# Patient Record
Sex: Female | Born: 2000 | Race: White | Hispanic: Yes | Marital: Single | Smoking: Never smoker
Health system: Southern US, Community
[De-identification: ages and names within clinical notes are randomized; demographics above are authoritative.]

## PROBLEM LIST (undated history)

## (undated) DIAGNOSIS — E119 Type 2 diabetes mellitus without complications: Secondary | ICD-10-CM

## (undated) DIAGNOSIS — S36113A Laceration of liver, unspecified degree, initial encounter: Secondary | ICD-10-CM

## (undated) HISTORY — DX: Type 2 diabetes mellitus without complications: E11.9

## (undated) HISTORY — DX: Laceration of liver, unspecified degree, initial encounter: S36.113A

---
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
## 2020-01-01 ENCOUNTER — Other Ambulatory Visit (INDEPENDENT_AMBULATORY_CARE_PROVIDER_SITE_OTHER): Payer: Self-pay | Admitting: Family

## 2020-01-01 MED ORDER — INSULIN LISPRO 100 UNIT/ML ~~LOC~~ SOLN: SUBCUTANEOUS | 5 refills | Status: DC

## 2020-01-07 ENCOUNTER — Other Ambulatory Visit (INDEPENDENT_AMBULATORY_CARE_PROVIDER_SITE_OTHER): Payer: Self-pay | Admitting: Family

## 2020-01-07 DIAGNOSIS — E1065 Type 1 diabetes mellitus with hyperglycemia: Secondary | ICD-10-CM

## 2020-01-11 ENCOUNTER — Other Ambulatory Visit: Payer: Self-pay

## 2020-01-11 ENCOUNTER — Encounter: Payer: Self-pay | Admitting: Cardiology

## 2020-01-11 ENCOUNTER — Ambulatory Visit (INDEPENDENT_AMBULATORY_CARE_PROVIDER_SITE_OTHER): Payer: BC Managed Care – PPO | Admitting: Cardiology

## 2020-01-11 VITALS — BP 110/62 | HR 71 | Ht <= 58 in | Wt 174.4 lb

## 2020-01-11 DIAGNOSIS — R002 Palpitations: Secondary | ICD-10-CM | POA: Diagnosis not present

## 2020-01-11 DIAGNOSIS — Z7189 Other specified counseling: Secondary | ICD-10-CM

## 2020-01-11 DIAGNOSIS — R072 Precordial pain: Secondary | ICD-10-CM | POA: Diagnosis not present

## 2020-01-11 DIAGNOSIS — E109 Type 1 diabetes mellitus without complications: Secondary | ICD-10-CM

## 2020-01-11 NOTE — Patient Instructions (Signed)
Medication Instructions:  Your Physician recommend you continue on your current medication as directed.    *If you need a refill on your cardiac medications before your next appointment, please call your pharmacy*  Lab Work: None  Testing/Procedures: None  Follow-Up: At [facility], you and your health needs are our priority.  As part of our continuing mission to provide you with exceptional heart care, we have created designated Provider Care Teams.  These Care Teams include your primary Cardiologist (physician) and Advanced Practice Providers (APPs -  Physician Assistants and Nurse Practitioners) who all work together to provide you with the care you need, when you need it.  Your next appointment:   As needed   The format for your next appointment:   Either In Person or Virtual  Provider:   Bridgette Christopher, MD   

## 2020-01-11 NOTE — Progress Notes (Signed)
Cardiology Office Note:    Date:  01/11/2020   ID:  Chelsey [name], DOB [birth year], MRN [id number]  PCP:  Patient, No Pcp Per  Cardiologist:  Buford Dresser, MD  Referring MD: Hermenia Bers, NP   CC: new patient consultation for the evaluation and management of palpitations and chest pain  History of Present Illness:    Chelsey [name] is a 19 y.o. female with a hx of type I diabetes who is seen as a new consult at the request of Hermenia Bers, NP for the evaluation and management of palpitations and chest pain.  Note from 11/06/19 with Hermenia Bers reviewed. She noted random palpitations that last several minutes.  Chest pain and palpitations: -Initial onset: a pressure sensation throughout her left side of the chest or the top central of her chest, mild, does not stop her from doing her daily activities. She denies any racing or fluttering sensations. -Frequency/Duration: has been decreasing in frequency, only 3 times since 10/2019. On average, lasted about 7 minutes. -Associated symptoms: she cannot clearly associated any additional symptoms -Aggravating/alleviating factors: no clear factors that she can determine.  -Syncope/near syncope: none -Prior cardiac history: none -Prior ECG: NSR -Prior workup: none -Prior treatment: none -Possible medication interactions: on insulin for type I diabetes -Caffeine: has cut back from last year, now down from 10-12 cups of coffee/week to now 5-6 cups/week -Alcohol: none recently -Tobacco: never -OTC supplements: rare ibuprofen -Comorbidities: type I diabetes -Exercise level: no limitations. 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 -Cardiac ROS: no shortness of breath recently, no PND, no orthopnea, no LE edema. -Family history: no CV history that she knows of.  Past Medical History:  Diagnosis Date  . Liver  laceration 01/16/14   sledding accident    History reviewed. No pertinent surgical history.  Current Medications: Current Outpatient Medications on File Prior to Visit  Medication Sig  . Accu-Chek FastClix Lancets MISC Check sugar 8 x daily  . BD PEN NEEDLE NANO U/F 32G X 4 MM MISC Use to inject insulin up to 8x per day,.  . Blood Glucose Monitoring Suppl (ACCU-CHEK GUIDE) w/Device KIT 1 kit by Does not apply route as needed.  . Continuous Blood Gluc Sensor (DEXCOM G6 SENSOR) MISC 1 KIT BY DOES NOT APPLY ROUTE DAILY AS NEEDED.  Marland Kitchen Glucagon (BAQSIMI TWO PACK) 3 MG/DOSE POWD Place 3 mg into the nose as needed.  Marland Kitchen glucagon 1 MG injection Follow package directions for low blood sugar.  Marland Kitchen glucose blood (ACCU-CHEK GUIDE) test strip Check glucose 6x daily  . ibuprofen (ADVIL,MOTRIN) 200 MG tablet Take 200 mg by mouth every 6 (six) hours as needed for headache.  . Insulin Glargine (LANTUS SOLOSTAR) 100 UNIT/ML Solostar Pen INJECT UP TO 50 UNITS SUBCUTANEOUSLY PER DAY  . insulin lispro (HUMALOG) 100 UNIT/ML injection Give up to 100 units per day via insulin pump  . OneTouch Delica Lancets 46K MISC 1 Units by Does not apply route as directed.  . Continuous Blood Gluc Transmit (DEXCOM G6 TRANSMITTER) MISC 1 kit by Does not apply route daily.   No current facility-administered medications on file prior to visit.     Allergies:   Patient has no known allergies.   Social History   Tobacco Use  . Smoking status: Never Smoker  . Smokeless tobacco: Never Used  Substance Use Topics  . Alcohol use: No  . Drug use: No  Family History: family history includes Diabetes in her maternal grandfather; Hyperlipidemia in her maternal grandfather; Learning disabilities in her cousin and mother; Thyroid disease in her maternal grandmother; Vision loss in her maternal grandfather.  ROS:   Please see the history of present illness.  Additional pertinent ROS: Constitutional: Negative for chills, fever,  night sweats, unintentional weight loss  HENT: Negative for ear pain and hearing loss.   Eyes: Negative for loss of vision and eye pain.  Respiratory: Negative for cough, sputum, wheezing.   Cardiovascular: See HPI. Gastrointestinal: Negative for abdominal pain, melena, and hematochezia.  Genitourinary: Negative for dysuria and hematuria.  Musculoskeletal: Negative for falls and myalgias.  Skin: Negative for itching and rash.  Neurological: Negative for focal weakness, focal sensory changes and loss of consciousness.  Endo/Heme/Allergies: Does not bruise/bleed easily.     EKGs/Labs/Other Studies Reviewed:    The following studies were reviewed today: none  EKG:  EKG is personally reviewed.  The ekg ordered today demonstrates sinus rhythm with sinus arrhythmia, normal for age  Recent Labs: 11/06/2019: ALT 14; BUN 11; Creat 0.78; Potassium 4.2; Sodium 136; TSH 2.16  Recent Lipid Panel    Component Value Date/Time   CHOL 179 (H) 11/06/2019 1112   TRIG 98 (H) 11/06/2019 1112   HDL 55 11/06/2019 1112   CHOLHDL 3.3 11/06/2019 1112   VLDL 14 12/20/2013 1148   LDLCALC 105 11/06/2019 1112    Physical Exam:    VS:  BP 110/62   Pulse 71   Ht 4' 10" (1.473 m)   Wt 174 lb 6.4 oz (79.1 kg)   SpO2 98%   BMI 36.45 kg/m     Wt Readings from Last 3 Encounters:  01/11/20 174 lb 6.4 oz (79.1 kg) (94 %, Z= 1.52)*  11/06/19 172 lb 3.2 oz (78.1 kg) (93 %, Z= 1.49)*  07/13/19 167 lb (75.8 kg) (92 %, Z= 1.40)*   * Growth percentiles are based on CDC (Girls, 2-20 Years) data.    GEN: Well nourished, well developed in no acute distress HEENT: Normal, moist mucous membranes NECK: No JVD CARDIAC: regular rhythm, normal S1 and S2, no rubs or gallops. No murmurs. VASCULAR: Radial and DP pulses 2+ bilaterally. No carotid bruits RESPIRATORY:  Clear to auscultation without rales, wheezing or rhonchi  ABDOMEN: Soft, non-tender, non-distended MUSCULOSKELETAL:  Ambulates independently SKIN: Warm  and dry, no edema NEUROLOGIC:  Alert and oriented x 3. No focal neuro deficits noted. PSYCHIATRIC:  Normal affect    ASSESSMENT:    1. Precordial pain   2. Heart palpitations   3. Type 1 diabetes mellitus without complication (HCC)   4. Cardiac risk counseling   5. Counseling on health promotion and disease prevention    PLAN:    Atypical chest pain, palpitations:  We spent significant time today reviewing different parts of the cardiovascular system (electrical, vascular, functional, and valvular). We discussed how each of these systems can present with different symptoms. We reviewed that there are many noncardiac causes as well that can contribute to symptoms. -we discussed a monitor for her palpitations. As her symptoms have been decreasing, this is likely to be low yield at this time. However, if symptoms increase in frequency, we will send a monitor to rule out arrhythmias -we discussed the chest pain at length today. We discussed that, even with her several year history of type I diabetes, her risk for CAD is exceptionally low. Based on this, guidelines would recommend not testing given risk of false  positive results. -we discuss etiology of CAD and Addo term risk, see below  Type I diabetes: We discussed [place] term CV risk and diabetes. She was tearful about this, as she is concerned that the trouble with glucose control will affect her organs in the [place] term. She has an upcoming appt to get her insulin pump set up.  -we discussed at length that this is Mitch term risk. We discussed that good glucose control is key to preventing [place] term complications, and she is already focused on this -with her low ASCVD risk and young age, would not start statin until post childbearing  Cardiac risk counseling and prevention recommendations: -recommend heart healthy/Mediterranean diet, with whole grains, fruits, vegetable, fish, lean meats, nuts, and olive oil. Limit salt. -recommend moderate  walking, 3-5 times/week for 30-50 minutes each session. Aim for at least 150 minutes.week. Goal should be pace of 3 miles/hours, or walking 1.5 miles in 30 minutes -recommend avoidance of tobacco products. Avoid excess alcohol. -Additional risk factor control:  -Diabetes risk: A1c is 8.2, working on type I diabetes control as above  -Lipids: reviewed as above, no indication for statin givne age  -Blood pressure control: at goal on no meds  -Weight: BMI 36, working on this -ASCVD risk score: The ASCVD Risk score Mikey Bussing DC Jr., et al., 2013) failed to calculate for the following reasons:   The 2013 ASCVD risk score is only valid for ages 21 to 67    Plan for follow up: given her young age, she can follow up as needed for now, but her CV risk factors should be followed closely. I would be happy to see her again if additional questions arise.  Medication Adjustments/Labs and Tests Ordered: Current medicines are reviewed at length with the patient today.  Concerns regarding medicines are outlined above.  Orders Placed This Encounter  Procedures  . EKG 12-Lead   No orders of the defined types were placed in this encounter.   Patient Instructions  Medication Instructions:  Your Physician recommend you continue on your current medication as directed.    *If you need a refill on your cardiac medications before your next appointment, please call your pharmacy*  Lab Work: None  Testing/Procedures: None  Follow-Up: At [facility], you and your health needs are our priority.  As part of our continuing mission to provide you with exceptional heart care, we have created designated Provider Care Teams.  These Care Teams include your primary Cardiologist (physician) and Advanced Practice Providers (APPs -  Physician Assistants and Nurse Practitioners) who all work together to provide you with the care you need, when you need it.  Your next appointment:   As needed  The format for your next  appointment:   Either In Person or Virtual  Provider:   Buford Dresser, MD     Signed, Buford Dresser, MD PhD 01/11/2020  [place]

## 2020-01-17 ENCOUNTER — Other Ambulatory Visit (INDEPENDENT_AMBULATORY_CARE_PROVIDER_SITE_OTHER): Payer: Self-pay

## 2020-01-17 NOTE — Telephone Encounter (Signed)
A user error has taken place: encounter opened in error, closed for administrative reasons.

## 2020-01-28 ENCOUNTER — Other Ambulatory Visit (INDEPENDENT_AMBULATORY_CARE_PROVIDER_SITE_OTHER): Payer: Self-pay | Admitting: Family

## 2020-01-28 DIAGNOSIS — E1065 Type 1 diabetes mellitus with hyperglycemia: Secondary | ICD-10-CM

## 2020-01-28 MED ORDER — BAQSIMI TWO PACK 3 MG/DOSE NA POWD: 3.0000 mg | NASAL | 3 refills | Status: DC | PRN

## 2020-01-28 MED ORDER — ACCU-CHEK GUIDE VI STRP: ORAL_STRIP | 6 refills | Status: DC

## 2020-02-05 ENCOUNTER — Encounter (INDEPENDENT_AMBULATORY_CARE_PROVIDER_SITE_OTHER): Payer: Self-pay | Admitting: Family

## 2020-02-05 ENCOUNTER — Other Ambulatory Visit: Payer: Self-pay

## 2020-02-05 ENCOUNTER — Ambulatory Visit (INDEPENDENT_AMBULATORY_CARE_PROVIDER_SITE_OTHER): Payer: BC Managed Care – PPO | Admitting: Family

## 2020-02-05 VITALS — BP 112/72 | HR 80 | Ht <= 58 in | Wt 175.4 lb

## 2020-02-05 DIAGNOSIS — R739 Hyperglycemia, unspecified: Secondary | ICD-10-CM | POA: Diagnosis not present

## 2020-02-05 DIAGNOSIS — Z4681 Encounter for fitting and adjustment of insulin pump: Secondary | ICD-10-CM

## 2020-02-05 DIAGNOSIS — E1065 Type 1 diabetes mellitus with hyperglycemia: Secondary | ICD-10-CM

## 2020-02-05 DIAGNOSIS — F54 Psychological and behavioral factors associated with disorders or diseases classified elsewhere: Secondary | ICD-10-CM | POA: Diagnosis not present

## 2020-02-05 DIAGNOSIS — E10649 Type 1 diabetes mellitus with hypoglycemia without coma: Secondary | ICD-10-CM

## 2020-02-05 LAB — POCT GLYCOSYLATED HEMOGLOBIN (HGB A1C): Hemoglobin A1C: 6.9 % — AB (ref 4.0–5.6)

## 2020-02-05 LAB — POCT GLUCOSE (DEVICE FOR HOME USE): Glucose Fasting, POC: 117 mg/dL — AB (ref 70–99)

## 2020-02-05 NOTE — Patient Instructions (Signed)
-  Always have fast sugar with you in case of low blood sugar (glucose tabs, regular juice or soda, candy) -Always wear your ID that states you have diabetes -Always bring your meter to your visit -Call/Email if you want to review blood sugars   

## 2020-02-05 NOTE — Progress Notes (Signed)
Pediatric Endocrinology Diabetes Consultation Follow-up Visit  Chelsey [name] 2001-08-15 [id number]  Chief Complaint: Follow-up type 1 diabetes   Patient, No Pcp Per   HPI: Chelsey [name]  is a 19 y.o. female presenting for follow-up of type 1 diabetes. she is accompanied to this visit by her her mother, older brother and older sister. .  1. She presented to the ER on 10/26/2017 with 2 months of polyuria, polydipsia and weigh tloss. Her glucose was 547 on arrival to ER. She was admitted to PICU for IV insulin and fluids. Once stabilized she was transferred to the pediatric floor for diabetes education and was started on Basaglar and Novolog insulin.   2. Since her last visit to clinic on 06/2019, [name] has been generally healthy.  She has been busy with school and work. She finds online school harder to focus. In her free time she mainly relaxes and plays on her phone. She was started on Tslim insulin pump two weeks ago, she had online training. She was very upset because she was having frequent low blood sugars. She reached out to me a few days ago and found the problem was the her correction factor was set to 1 unit for every 4 mg/dl instead of 1 unit for every 40 mg/dl. Since the change she is not having lows.   Upon further review of her insulin pump. It was discovered that she had created a second profile (she is unsure how). Her basal rate was set at 3 units per hour which is double what it should be.    Insulin regimen: TSlim insulin pump  Basal Rates 12AM 1.60  6am 1.65  11pm 1.60          Insulin to Carbohydrate Ratio 12AM 9  6am 8  9pm 8           Insulin Sensitivity Factor 12AM 40  6am 30  11pm 40          Target Blood Glucose 12AM 120                 Hypoglycemia: Able to feel low blood sugars.  No glucagon needed recently.  Insulin Pump and CGm download   Med-alert ID: Not currently wearing. Injection sites: arms, legs and abdomen. Annual labs due:  Ordered.  Ophthalmology due: 10/2020    3. ROS: Greater than 10 systems reviewed with pertinent positives listed in HPI, otherwise neg. Constitutional: Weight stable. Sleeping well.  Eyes: No changes in vision. No blurry vision.  Ears/Nose/Mouth/Throat: No difficulty swallowing. No neck pain  Cardiovascular: No palpitations or tachycardia.  Respiratory: No increased work of breathing. No SOB  Gastrointestinal: No constipation or diarrhea. No abdominal pain Genitourinary: No nocturia, no polyuria Musculoskeletal: No joint pain Neurologic: Normal sensation, no tremor Endocrine: No polydipsia.  No hyperpigmentation Psychiatric:. Denies depression and anxiety.   Past Medical History:   Past Medical History:  Diagnosis Date  . Liver laceration 01/16/14   sledding accident    Medications:  Outpatient Encounter Medications as of 02/05/2020  Medication Sig  . Accu-Chek FastClix Lancets MISC Check sugar 8 x daily  . BD PEN NEEDLE NANO U/F 32G X 4 MM MISC Use to inject insulin up to 8x per day,.  . Blood Glucose Monitoring Suppl (ACCU-CHEK GUIDE) w/Device KIT 1 kit by Does not apply route as needed.  . Continuous Blood Gluc Sensor (DEXCOM G6 SENSOR) MISC 1 KIT BY DOES NOT APPLY ROUTE DAILY AS NEEDED.  Marland Kitchen Glucagon (BAQSIMI TWO PACK) 3  MG/DOSE POWD Place 3 mg into the nose as needed.  Marland Kitchen glucose blood (ACCU-CHEK GUIDE) test strip Check glucose 6x daily  . ibuprofen (ADVIL,MOTRIN) 200 MG tablet Take 200 mg by mouth every 6 (six) hours as needed for headache.  . Insulin Glargine (LANTUS SOLOSTAR) 100 UNIT/ML Solostar Pen INJECT UP TO 50 UNITS SUBCUTANEOUSLY PER DAY  . insulin lispro (HUMALOG) 100 UNIT/ML injection Give up to 100 units per day via insulin pump  . OneTouch Delica Lancets 15B MISC 1 Units by Does not apply route as directed.  . Continuous Blood Gluc Transmit (DEXCOM G6 TRANSMITTER) MISC 1 kit by Does not apply route daily.  Marland Kitchen glucagon 1 MG injection Follow package directions for low  blood sugar. (Patient not taking: Reported on 02/05/2020)   No facility-administered encounter medications on file as of 02/05/2020.    Allergies: No Known Allergies  Surgical History: No past surgical history on file.  Family History:  Family History  Problem Relation Age of Onset  . Learning disabilities Mother   . Thyroid disease Maternal Grandmother   . Diabetes Maternal Grandfather   . Hyperlipidemia Maternal Grandfather   . Vision loss Maternal Grandfather   . Learning disabilities Cousin        mat cousin has ADHD     Social History: Lives with: Mother, older brother and older sister.  Freshman at [employer]   Physical Exam:  Vitals:   02/05/20 1122  BP: 112/72  Pulse: 80  Weight: 175 lb 6.4 oz (79.6 kg)  Height: 4' 9.95" (1.472 m)   BP 112/72   Pulse 80   Ht 4' 9.95" (1.472 m)   Wt 175 lb 6.4 oz (79.6 kg)   LMP 01/13/2020 (Within Days)   BMI 36.72 kg/m  Body mass index: body mass index is 36.72 kg/m. Blood pressure percentiles are not available for patients who are 18 years or older.  Ht Readings from Last 3 Encounters:  02/05/20 4' 9.95" (1.472 m) (<1 %, Z= -2.47)*  01/11/20 4' 10" (1.473 m) (<1 %, Z= -2.45)*  11/06/19 4' 9.72" (1.466 m) (<1 %, Z= -2.56)*   * Growth percentiles are based on CDC (Girls, 2-20 Years) data.   Wt Readings from Last 3 Encounters:  02/05/20 175 lb 6.4 oz (79.6 kg) (94 %, Z= 1.54)*  01/11/20 174 lb 6.4 oz (79.1 kg) (94 %, Z= 1.52)*  11/06/19 172 lb 3.2 oz (78.1 kg) (93 %, Z= 1.49)*   * Growth percentiles are based on CDC (Girls, 2-20 Years) data.    Physical Exam   General: Well developed, well nourished female in no acute distress.  Alert and oriented.  Head: Normocephalic, atraumatic.   Eyes:  Pupils equal and round. EOMI.   Sclera white.  No eye drainage.   Ears/Nose/Mouth/Throat: Nares patent, no nasal drainage.  Normal dentition, mucous membranes moist.   Neck: supple, no cervical lymphadenopathy, no  thyromegaly Cardiovascular: regular rate, normal S1/S2, no murmurs Respiratory: No increased work of breathing.  Lungs clear to auscultation bilaterally.  No wheezes. Abdomen: soft, nontender, nondistended. Normal bowel sounds.  No appreciable masses  Extremities: warm, well perfused, cap refill < 2 sec.   Musculoskeletal: Normal muscle mass.  Normal strength Skin: warm, dry.  No rash or lesions. Neurologic: alert and oriented, normal speech, no tremor   Labs: Last hemoglobin A1c:8.2% on 10/2019 Lab Results  Component Value Date   HGBA1C 6.9 (A) 02/05/2020     Assessment/Plan: Chelsey [name] is a 19 y.o. female with uncontrolled  type 1 diabetes, recently started on insulin pump therapy. There appears to have been issues with her insulin pump education and she had the wrong settings entered which were causing frequent hypoglycemia. Restarted the correct insulin pump profile today. Hemoglobin A1c is 6.7% which meets ADA goal of <7.5%.  1-3. type 1 diabetes mellitus in pediatric patient (HCC)/Hyperglycemia/ hypoglycemia  - - Reviewed insulin pump and CGM download. Discussed trends and patterns.  - Rotate pump sites to prevent scar tissue.  - bolus 15 minutes prior to eating to limit blood sugar spikes.  - Reviewed carb counting and importance of accurate carb counting.  - Discussed signs and symptoms of hypoglycemia. Always have glucose available.  - POCT glucose and hemoglobin A1c  - Reviewed growth chart.  - Lipid panel, TFTs and Microalbumin ordered.    4. Maladaptive behavior - Discussed concerns and barriers to care  - Answered questions.   5. Insulin pump titration  - Profile changed    Insulin to Carbohydrate Ratio 12AM 9  6am 8--> 7   9pm 8             Follow-up: 3 months   LOS: >50 spent today reviewing the medical chart, counseling the patient/family, and documenting today's visit.  When a patient is on insulin, intensive monitoring of blood glucose levels is  necessary to avoid hyperglycemia and hypoglycemia. Severe hyperglycemia/hypoglycemia can lead to hospital admissions and be life threatening.    Hermenia Bers,  FNP-C  Pediatric Specialist  [address] [place]  [place], [address]  Tele: [phone]

## 2020-02-06 LAB — LIPID PANEL
Cholesterol: 187 mg/dL — ABNORMAL HIGH (ref <170)
HDL: 53 mg/dL (ref >45)
LDL Cholesterol (Calc): 120 mg/dL (calc) — ABNORMAL HIGH (ref <110)
Non-HDL Cholesterol (Calc): 134 mg/dL (calc) — ABNORMAL HIGH (ref <120)
Total CHOL/HDL Ratio: 3.5 (calc) (ref <5.0)
Triglycerides: 47 mg/dL (ref <90)

## 2020-02-06 LAB — MICROALBUMIN / CREATININE URINE RATIO
Creatinine, Urine: 185 mg/dL (ref 20–275)
Microalb Creat Ratio: 2 mcg/mg creat (ref <30)
Microalb, Ur: 0.3 mg/dL

## 2020-02-06 LAB — T4, FREE: Free T4: 0.8 ng/dL (ref 0.8–1.4)

## 2020-02-06 LAB — TSH: TSH: 1.44 mIU/L

## 2020-02-21 ENCOUNTER — Telehealth (INDEPENDENT_AMBULATORY_CARE_PROVIDER_SITE_OTHER): Payer: Self-pay | Admitting: Family

## 2020-02-21 NOTE — Telephone Encounter (Signed)
  Who's calling (name and relationship to patient) : Carlisia   Best contact number: [phone]  Provider they see: Gretchen Short   Reason for call: Patient called to speak with Spenser or clinic staff about the pen needle for her insulin pump. [facility] advised that they do not carry the pen needle that she needs and she is almost out of supplies. Please call patient to discuss further     PRESCRIPTION REFILL ONLY  Name of prescription:  Pharmacy:

## 2020-02-22 ENCOUNTER — Encounter (INDEPENDENT_AMBULATORY_CARE_PROVIDER_SITE_OTHER): Payer: Self-pay

## 2020-02-22 NOTE — Telephone Encounter (Signed)
Spoke with patient and she was referencing the 11ml syringe, and 26G needle she uses to draw insulin from the vial to her cartridge. This medical assistant informed her she would reach out the the [employer] rep to see if those were automatically included with the cartridge boxes that are shipped to the patients. If not then we will send a prescription to the pharmacy. Patient states understanding and ended the call.

## 2020-02-25 ENCOUNTER — Telehealth (INDEPENDENT_AMBULATORY_CARE_PROVIDER_SITE_OTHER): Payer: Self-pay | Admitting: Family

## 2020-02-25 NOTE — Telephone Encounter (Signed)
Who's calling (name and relationship to patient) : Chelsey [name]   Best contact number: [phone]  Provider they see: Gretchen Short  Reason for call: Patient called to check up on the status of her 3 ml syringe and 26G needle after speaking with Marijean Niemann last week.   Solae requested that if these supplies had to be sent to pharmacy they be sent to [facility]  Call ID:      PRESCRIPTION REFILL ONLY  Name of prescription:  Pharmacy:

## 2020-02-25 NOTE — Telephone Encounter (Signed)
Left a HIPAA compliant voicemail informing patient answers are in the previous mychart message and to call back if she has any further questions.

## 2020-02-29 ENCOUNTER — Other Ambulatory Visit (INDEPENDENT_AMBULATORY_CARE_PROVIDER_SITE_OTHER): Payer: Self-pay | Admitting: Family

## 2020-02-29 DIAGNOSIS — E1065 Type 1 diabetes mellitus with hyperglycemia: Secondary | ICD-10-CM

## 2020-05-08 ENCOUNTER — Encounter (INDEPENDENT_AMBULATORY_CARE_PROVIDER_SITE_OTHER): Payer: Self-pay | Admitting: Family

## 2020-05-08 ENCOUNTER — Ambulatory Visit (INDEPENDENT_AMBULATORY_CARE_PROVIDER_SITE_OTHER): Payer: BC Managed Care – PPO | Admitting: Family

## 2020-05-08 ENCOUNTER — Other Ambulatory Visit: Payer: Self-pay

## 2020-05-08 VITALS — BP 110/74 | HR 70 | Wt 171.4 lb

## 2020-05-08 DIAGNOSIS — E1065 Type 1 diabetes mellitus with hyperglycemia: Secondary | ICD-10-CM

## 2020-05-08 DIAGNOSIS — Z9641 Presence of insulin pump (external) (internal): Secondary | ICD-10-CM | POA: Diagnosis not present

## 2020-05-08 DIAGNOSIS — E10649 Type 1 diabetes mellitus with hypoglycemia without coma: Secondary | ICD-10-CM | POA: Diagnosis not present

## 2020-05-08 DIAGNOSIS — F4320 Adjustment disorder, unspecified: Secondary | ICD-10-CM

## 2020-05-08 DIAGNOSIS — R739 Hyperglycemia, unspecified: Secondary | ICD-10-CM | POA: Diagnosis not present

## 2020-05-08 LAB — POCT GLUCOSE (DEVICE FOR HOME USE): POC Glucose: 126 mg/dl — AB (ref 70–99)

## 2020-05-08 LAB — POCT GLYCOSYLATED HEMOGLOBIN (HGB A1C): Hemoglobin A1C: 6.8 % — AB (ref 4.0–5.6)

## 2020-05-08 NOTE — Progress Notes (Signed)
Pediatric Endocrinology Diabetes Consultation Follow-up Visit  Chelsey [name] January 08, 2001 [id number]  Chief Complaint: Follow-up type 1 diabetes   Patient, No Pcp Per   HPI: Chelsey [name]  is a 19 y.o. female presenting for follow-up of type 1 diabetes. she is accompanied to this visit by her her mother, older brother and older sister. .  1. She presented to the ER on 10/26/2017 with 2 months of polyuria, polydipsia and weigh tloss. Her glucose was 547 on arrival to ER. She was admitted to PICU for IV insulin and fluids. Once stabilized she was transferred to the pediatric floor for diabetes education and was started on Basaglar and Novolog insulin.   2. Since her last visit to clinic on 06/2019, [name] has been generally healthy.  She is using [employer] Tslim insulin pump with Dexcom CGM and Control IQ technology.  Concerns:  - Busy with work and school. Trying to find "balance"  - Frustration with varying blood sugars based on exercise and food. She is reducing some boluses for foods with faster acting carbs and increasing bolus when she eats pizza or pasta. This seems to be helping.  - What to do when she has failed pump site.   Insulin regimen: TSlim insulin pump  Basal Rates 12AM 1.60  6am 1.65  11pm 1.60          Insulin to Carbohydrate Ratio 12AM 9  6am 6  9pm 6          Insulin Sensitivity Factor 12AM 40  6am 30  11pm 40          Target Blood Glucose 12AM 120                 Hypoglycemia: Able to feel low blood sugars.  No glucagon needed recently.  Insulin Pump and CGm download   Med-alert ID: Not currently wearing. Injection sites: arms, legs and abdomen. Annual labs due: Ordered.  Ophthalmology due: 10/2020    3. ROS: Greater than 10 systems reviewed with pertinent positives listed in HPI, otherwise neg. Constitutional: Weight stable. Sleeping well.  Eyes: No changes in vision. No blurry vision.  Ears/Nose/Mouth/Throat: No difficulty swallowing. No  neck pain  Cardiovascular: No palpitations or tachycardia.  Respiratory: No increased work of breathing. No SOB  Gastrointestinal: No constipation or diarrhea. No abdominal pain Genitourinary: No nocturia, no polyuria Musculoskeletal: No joint pain Neurologic: Normal sensation, no tremor Endocrine: No polydipsia.  No hyperpigmentation Psychiatric:. Denies depression and anxiety.   Past Medical History:   Past Medical History:  Diagnosis Date  . Liver laceration 01/16/14   sledding accident    Medications:  Outpatient Encounter Medications as of 05/08/2020  Medication Sig  . insulin lispro (HUMALOG) 100 UNIT/ML injection Give up to 100 units per day via insulin pump  . Accu-Chek FastClix Lancets MISC Check sugar 8 x daily (Patient not taking: Reported on 05/08/2020)  . BD PEN NEEDLE NANO U/F 32G X 4 MM MISC Use to inject insulin up to 8x per day,. (Patient not taking: Reported on 05/08/2020)  . Blood Glucose Monitoring Suppl (ACCU-CHEK GUIDE) w/Device KIT 1 kit by Does not apply route as needed. (Patient not taking: Reported on 05/08/2020)  . Continuous Blood Gluc Sensor (DEXCOM G6 SENSOR) MISC 1 KIT BY DOES NOT APPLY ROUTE DAILY AS NEEDED. (Patient not taking: Reported on 05/08/2020)  . Continuous Blood Gluc Transmit (DEXCOM G6 TRANSMITTER) MISC USE AS DIRECTED (Patient not taking: Reported on 05/08/2020)  . Glucagon (BAQSIMI TWO PACK) 3 MG/DOSE POWD  Place 3 mg into the nose as needed. (Patient not taking: Reported on 05/08/2020)  . glucagon 1 MG injection Follow package directions for low blood sugar. (Patient not taking: Reported on 02/05/2020)  . glucose blood (ACCU-CHEK GUIDE) test strip Check glucose 6x daily (Patient not taking: Reported on 05/08/2020)  . ibuprofen (ADVIL,MOTRIN) 200 MG tablet Take 200 mg by mouth every 6 (six) hours as needed for headache. (Patient not taking: Reported on 05/08/2020)  . Insulin Glargine (LANTUS SOLOSTAR) 100 UNIT/ML Solostar Pen INJECT UP TO 50 UNITS  SUBCUTANEOUSLY PER DAY (Patient not taking: Reported on 05/08/2020)  . OneTouch Delica Lancets 20B MISC 1 Units by Does not apply route as directed. (Patient not taking: Reported on 05/08/2020)   No facility-administered encounter medications on file as of 05/08/2020.    Allergies: No Known Allergies  Surgical History: No past surgical history on file.  Family History:  Family History  Problem Relation Age of Onset  . Learning disabilities Mother   . Thyroid disease Maternal Grandmother   . Diabetes Maternal Grandfather   . Hyperlipidemia Maternal Grandfather   . Vision loss Maternal Grandfather   . Learning disabilities Cousin        mat cousin has ADHD     Social History: Lives with: Mother, older brother and older sister.  Freshman at [employer]   Physical Exam:  Vitals:   05/08/20 0909  BP: 110/74  Pulse: 70  Weight: 171 lb 6.4 oz (77.7 kg)   BP 110/74   Pulse 70   Wt 171 lb 6.4 oz (77.7 kg)   BMI 35.88 kg/m  Body mass index: body mass index is 35.88 kg/m. Blood pressure percentiles are not available for patients who are 18 years or older.  Ht Readings from Last 3 Encounters:  02/05/20 4' 9.95" (1.472 m) (<1 %, Z= -2.47)*  01/11/20 _0  (1.473 m) (<1 %, Z= -2.45)*  11/06/19 4' 9.72" (1.466 m) (<1 %, Z= -2.56)*   * Growth percentiles are based on CDC (Girls, 2-20 Years) data.   Wt Readings from Last 3 Encounters:  05/08/20 171 lb 6.4 oz (77.7 kg) (92 %, Z= 1.44)*  02/05/20 175 lb 6.4 oz (79.6 kg) (94 %, Z= 1.54)*  01/11/20 174 lb 6.4 oz (79.1 kg) (94 %, Z= 1.52)*   * Growth percentiles are based on CDC (Girls, 2-20 Years) data.    Physical Exam  General: Well developed, well nourished female in no acute distress.   Head: Normocephalic, atraumatic.   Eyes:  Pupils equal and round. EOMI.   Sclera white.  No eye drainage.   Ears/Nose/Mouth/Throat: Nares patent, no nasal drainage.  Normal dentition, mucous membranes moist.   Neck: supple, no cervical  lymphadenopathy, no thyromegaly Cardiovascular: regular rate, normal S1/S2, no murmurs Respiratory: No increased work of breathing.  Lungs clear to auscultation bilaterally.  No wheezes. Abdomen: soft, nontender, nondistended. Normal bowel sounds.  No appreciable masses  Extremities: warm, well perfused, cap refill < 2 sec.   Musculoskeletal: Normal muscle mass.  Normal strength Skin: warm, dry.  No rash or lesions. Neurologic: alert and oriented, normal speech, no tremor    Labs: Last hemoglobin A1c:6.9% on 01/2020  Lab Results  Component Value Date   HGBA1C 6.8 (A) 05/08/2020     Assessment/Plan: Charmayne is a 18 y.o. female with uncontrolled type 1 diabetes, recently started on insulin pump therapy. Blood sugars are overall well controlled with control IQ therapy. Hemoglobin A1c is 6.8% which meets the ADA goal of <  7%.   1-3. type 1 diabetes mellitus in pediatric patient (HCC)/Hyperglycemia/ hypoglycemia  - Reviewed insulin pump and CGM download. Discussed trends and patterns.  - Rotate pump sites to prevent scar tissue.  - bolus 15 minutes prior to eating to limit blood sugar spikes.  - Reviewed carb counting and importance of accurate carb counting.  - Discussed signs and symptoms of hypoglycemia. Always have glucose available.  - POCT glucose and hemoglobin A1c  - Reviewed growth chart.  - Advised that if she believes she has pump site failure or blood sugar is not decreasing with 3 hours of giving correction bolus then she should change site. Always keep extra pump sites available.   4. Adjustment reaction  - Discussed concerns and barriers to care  - Answered questions.   5. Insulin pump titration  - No changes today. Pump is in place.    Follow-up: 3 months   >45 spent today reviewing the medical chart, counseling the patient/family, and documenting today's visit.   When a patient is on insulin, intensive monitoring of blood glucose levels is necessary to avoid  hyperglycemia and hypoglycemia. Severe hyperglycemia/hypoglycemia can lead to hospital admissions and be life threatening.    Hermenia Bers,  FNP-C  Pediatric Specialist  [address] [place]  [place], [address]  Tele: [phone]

## 2020-05-08 NOTE — Patient Instructions (Signed)
-  Always have fast sugar with you in case of low blood sugar (glucose tabs, regular juice or soda, candy) -Always wear your ID that states you have diabetes -Always bring your meter to your visit -Call/Email if you want to review blood sugars  Please sign up for MyChart. This is a communication tool that allows you to send an email directly to me. This can be used for questions, prescriptions and blood sugar reports. We will also release labs to you with instructions on MyChart. Please do not use MyChart if you need immediate or emergency assistance. Ask our wonderful front office staff if you need assistance.   

## 2020-05-15 ENCOUNTER — Ambulatory Visit (INDEPENDENT_AMBULATORY_CARE_PROVIDER_SITE_OTHER): Payer: BC Managed Care – PPO | Admitting: Family

## 2020-07-13 ENCOUNTER — Encounter (INDEPENDENT_AMBULATORY_CARE_PROVIDER_SITE_OTHER): Payer: Self-pay

## 2020-07-13 DIAGNOSIS — E1065 Type 1 diabetes mellitus with hyperglycemia: Secondary | ICD-10-CM

## 2020-07-14 ENCOUNTER — Telehealth (INDEPENDENT_AMBULATORY_CARE_PROVIDER_SITE_OTHER): Payer: Self-pay | Admitting: Family

## 2020-07-14 MED ORDER — DEXCOM G6 SENSOR MISC: 5 refills | Status: DC

## 2020-07-14 NOTE — Telephone Encounter (Signed)
  Who's calling (name and relationship to patient) :self / Chelsey [name]   Best contact number:[phone]  Provider they FWY:OVZCHYI Dalbert Garnet   Reason for call:Req a call back for questions about medication. Please advise. She will be at work at 4 and will not be able to pick up after that time.      PRESCRIPTION REFILL ONLY  Name of prescription:  Pharmacy:

## 2020-07-14 NOTE — Telephone Encounter (Signed)
Left voicemail for patient to call back. 

## 2020-07-16 NOTE — Telephone Encounter (Signed)
Patient states that she no longer needs a call back.

## 2020-08-04 ENCOUNTER — Encounter (INDEPENDENT_AMBULATORY_CARE_PROVIDER_SITE_OTHER): Payer: Self-pay

## 2020-08-07 ENCOUNTER — Encounter (INDEPENDENT_AMBULATORY_CARE_PROVIDER_SITE_OTHER): Payer: Self-pay

## 2020-08-07 ENCOUNTER — Telehealth (INDEPENDENT_AMBULATORY_CARE_PROVIDER_SITE_OTHER): Payer: BC Managed Care – PPO | Admitting: Family

## 2020-08-07 VITALS — Wt 169.7 lb

## 2020-08-07 DIAGNOSIS — Z9641 Presence of insulin pump (external) (internal): Secondary | ICD-10-CM

## 2020-08-07 DIAGNOSIS — E10649 Type 1 diabetes mellitus with hypoglycemia without coma: Secondary | ICD-10-CM

## 2020-08-07 DIAGNOSIS — R739 Hyperglycemia, unspecified: Secondary | ICD-10-CM

## 2020-08-07 DIAGNOSIS — F4320 Adjustment disorder, unspecified: Secondary | ICD-10-CM

## 2020-08-07 DIAGNOSIS — E1065 Type 1 diabetes mellitus with hyperglycemia: Secondary | ICD-10-CM

## 2020-08-07 NOTE — Patient Instructions (Signed)

## 2020-08-07 NOTE — Progress Notes (Signed)
This is a Pediatric Specialist E-Visit follow up consult provided via [place]  consented to an E-Visit consult today.  Location of patient: Chelsey [name] is at work [employer] and Chelsey [name] [id number] Location of provider: Hermenia Bers  is at Pediatric Specialist Patient was referred by [name]   The following participants were involved in this E-Visit: [name] and Srihith Aquilino FNP-C   Chief Complain/ Reason for E-Visit today: DM1 follow up  Total time on call: >30 spent today reviewing the medical chart, counseling the patient/family, and documenting today's visit.   Follow up: 3 months.    Pediatric Endocrinology Diabetes Consultation Follow-up Visit  Chelsey [name] [birth year] [id number]  Chief Complaint: Follow-up type 1 diabetes   Patient, No Pcp Per   HPI: Chelsey [name]  is a 19 y.o. female presenting for follow-up of type 1 diabetes. she is accompanied to this visit by her her mother, older brother and older sister. .  1. She presented to the ER on 10/26/2017 with 2 months of polyuria, polydipsia and weigh tloss. Her glucose was 547 on arrival to ER. She was admitted to PICU for IV insulin and fluids. Once stabilized she was transferred to the pediatric floor for diabetes education and was started on Basaglar and Novolog insulin.   2. Since her last visit to clinic on 04/2020, [name] has been generally healthy.  She has been busy with work and school. Currently in class at [employer]   She is using TSlim insulin pump and Dexcom CGM. She feels like her blood sugars have been pretty good. Notices a pattern of hyperglycemia around 8pm which is when she is busy at work. She has been eating snacks but forgets to bolus for it. Hypoglycemia is rare.   Insulin regimen: TSlim insulin pump  Basal Rates 12AM 1.60  6am 1.65  11pm 1.60          Insulin to Carbohydrate Ratio 12AM 9  6am 6  9pm 6          Insulin Sensitivity Factor 12AM  40  6am 30  11pm 40          Target Blood Glucose 12AM 120                 Hypoglycemia: Able to feel low blood sugars.  No glucagon needed recently.  Insulin Pump and CGm download   Med-alert ID: Not currently wearing. Injection sites: arms, legs and abdomen. Annual labs due: Ordered.  Ophthalmology due: 10/2020    3. ROS: Greater than 10 systems reviewed with pertinent positives listed in HPI, otherwise neg. Constitutional: Weight stable. Sleeping well.  Eyes: No changes in vision. No blurry vision.  Ears/Nose/Mouth/Throat: No difficulty swallowing. No neck pain  Cardiovascular: No palpitations or tachycardia.  Respiratory: No increased work of breathing. No SOB  Gastrointestinal: No constipation or diarrhea. No abdominal pain Genitourinary: No nocturia, no polyuria Musculoskeletal: No joint pain Neurologic: Normal sensation, no tremor Endocrine: No polydipsia.  No hyperpigmentation Psychiatric:. Denies depression and anxiety.   Past Medical History:   Past Medical History:  Diagnosis Date  . Diabetes mellitus without complication ([place])    Phreesia 08/07/2020  . Liver laceration 01/16/14   sledding accident    Medications:  Outpatient Encounter Medications as of 08/07/2020  Medication Sig  . Continuous Blood Gluc Sensor (DEXCOM G6 SENSOR) MISC Please change sensor every 10 days  . Continuous Blood Gluc Transmit (DEXCOM G6 TRANSMITTER) MISC USE AS DIRECTED  . Accu-Chek  FastClix Lancets MISC Check sugar 8 x daily (Patient not taking: Reported on 05/08/2020)  . Blood Glucose Monitoring Suppl (ACCU-CHEK GUIDE) w/Device KIT 1 kit by Does not apply route as needed. (Patient not taking: Reported on 05/08/2020)  . Glucagon (BAQSIMI TWO PACK) 3 MG/DOSE POWD Place 3 mg into the nose as needed. (Patient not taking: Reported on 05/08/2020)  . glucagon 1 MG injection Follow package directions for low blood sugar. (Patient not taking: Reported on 02/05/2020)  . glucose blood  (ACCU-CHEK GUIDE) test strip Check glucose 6x daily (Patient not taking: Reported on 05/08/2020)  . ibuprofen (ADVIL,MOTRIN) 200 MG tablet Take 200 mg by mouth every 6 (six) hours as needed for headache. (Patient not taking: Reported on 05/08/2020)  . Insulin Glargine (LANTUS SOLOSTAR) 100 UNIT/ML Solostar Pen INJECT UP TO 50 UNITS SUBCUTANEOUSLY PER DAY (Patient not taking: Reported on 05/08/2020)  . insulin lispro (HUMALOG) 100 UNIT/ML injection Give up to 100 units per day via insulin pump  . OneTouch Delica Lancets 00P MISC 1 Units by Does not apply route as directed. (Patient not taking: Reported on 05/08/2020)   No facility-administered encounter medications on file as of 08/07/2020.    Allergies: No Known Allergies  Surgical History: No past surgical history on file.  Family History:  Family History  Problem Relation Age of Onset  . Learning disabilities Mother   . Thyroid disease Maternal Grandmother   . Diabetes Maternal Grandfather   . Hyperlipidemia Maternal Grandfather   . Vision loss Maternal Grandfather   . Learning disabilities Cousin        mat cousin has ADHD     Social History: Lives with: Mother, older brother and older sister.  Freshman at [employer]   Physical Exam:  Vitals:   08/07/20 0946  Weight: 169 lb 11.2 oz (77 kg)   Wt 169 lb 11.2 oz (77 kg)   LMP 07/29/2020 (Exact Date)   BMI 35.52 kg/m  Body mass index: body mass index is 35.52 kg/m. Blood pressure percentiles are not available for patients who are 18 years or older.  Ht Readings from Last 3 Encounters:  02/05/20 4' 9.95" (1.472 m) (<1 %, Z= -2.47)*  01/11/20 _0  (1.473 m) (<1 %, Z= -2.45)*  11/06/19 4' 9.72" (1.466 m) (<1 %, Z= -2.56)*   * Growth percentiles are based on CDC (Girls, 2-20 Years) data.   Wt Readings from Last 3 Encounters:  08/07/20 169 lb 11.2 oz (77 kg) (92 %, Z= 1.38)*  05/08/20 171 lb 6.4 oz (77.7 kg) (92 %, Z= 1.44)*  02/05/20 175 lb 6.4 oz (79.6 kg) (94 %, Z= 1.54)*    * Growth percentiles are based on CDC (Girls, 2-20 Years) data.    Physical Exam  General: Well developed, well nourished female in no acute distress.   Head: Normocephalic, atraumatic.   Eyes:  Pupils equal and round. EOMI.   Sclera white.  No eye drainage.   Ears/Nose/Mouth/Throat: Nares patent, no nasal drainage.  Normal dentition, mucous membranes moist.   Neck: supple, no cervical lymphadenopathy, no thyromegaly Cardiovascular: regular rate, normal S1/S2, no murmurs Respiratory: No increased work of breathing.  Lungs clear to auscultation bilaterally.  No wheezes. Abdomen: soft, nontender, nondistended. Normal bowel sounds.  No appreciable masses  Extremities: warm, well perfused, cap refill < 2 sec.   Musculoskeletal: Normal muscle mass.  Normal strength Skin: warm, dry.  No rash or lesions. Neurologic: alert and oriented, normal speech, no tremor    Labs: Last hemoglobin  A1c:6.8% on 04/2020  Lab Results  Component Value Date   HGBA1C 6.8 (A) 05/08/2020     Assessment/Plan: Sybrina is a 19 y.o. female with type 1 diabetes on insulin pump therapy. Currently in good control with Tslim/Dexcom combination. No changes to settings needed at this time.   1-3. type 1 diabetes mellitus in pediatric patient (HCC)/Hyperglycemia/ hypoglycemia  - Reviewed insulin pump and CGM download. Discussed trends and patterns.  - Rotate pump sites to prevent scar tissue.  - bolus 15 minutes prior to eating to limit blood sugar spikes.  - Reviewed carb counting and importance of accurate carb counting.  - Discussed signs and symptoms of hypoglycemia. Always have glucose available.  - POCT glucose and hemoglobin A1c  - Reviewed growth chart.   4. Adjustment reaction  - Discussed balancing diabetes care with school and work  - answered questions.   5. Insulin pump titration  - No changes today. Pump is in place.    Follow-up: 3 months   When a patient is on insulin, intensive  monitoring of blood glucose levels is necessary to avoid hyperglycemia and hypoglycemia. Severe hyperglycemia/hypoglycemia can lead to hospital admissions and be life threatening.    Hermenia Bers,  FNP-C  Pediatric Specialist  [address] [place]  [place], [address]  Tele: [phone]

## 2020-08-12 ENCOUNTER — Other Ambulatory Visit (INDEPENDENT_AMBULATORY_CARE_PROVIDER_SITE_OTHER): Payer: Self-pay | Admitting: Family

## 2020-09-01 ENCOUNTER — Encounter (INDEPENDENT_AMBULATORY_CARE_PROVIDER_SITE_OTHER): Payer: Self-pay

## 2020-09-13 ENCOUNTER — Other Ambulatory Visit (INDEPENDENT_AMBULATORY_CARE_PROVIDER_SITE_OTHER): Payer: Self-pay | Admitting: Family

## 2020-09-13 DIAGNOSIS — E1065 Type 1 diabetes mellitus with hyperglycemia: Secondary | ICD-10-CM

## 2020-11-06 ENCOUNTER — Encounter (INDEPENDENT_AMBULATORY_CARE_PROVIDER_SITE_OTHER): Payer: Self-pay | Admitting: Family

## 2020-11-06 ENCOUNTER — Other Ambulatory Visit (INDEPENDENT_AMBULATORY_CARE_PROVIDER_SITE_OTHER): Payer: Self-pay | Admitting: Family

## 2020-11-06 ENCOUNTER — Ambulatory Visit (INDEPENDENT_AMBULATORY_CARE_PROVIDER_SITE_OTHER): Payer: BC Managed Care – PPO | Admitting: Family

## 2020-11-06 ENCOUNTER — Other Ambulatory Visit: Payer: Self-pay

## 2020-11-06 VITALS — BP 106/72 | HR 84 | Wt 166.5 lb

## 2020-11-06 DIAGNOSIS — Z4681 Encounter for fitting and adjustment of insulin pump: Secondary | ICD-10-CM

## 2020-11-06 DIAGNOSIS — E1065 Type 1 diabetes mellitus with hyperglycemia: Secondary | ICD-10-CM

## 2020-11-06 DIAGNOSIS — E10649 Type 1 diabetes mellitus with hypoglycemia without coma: Secondary | ICD-10-CM

## 2020-11-06 DIAGNOSIS — F4320 Adjustment disorder, unspecified: Secondary | ICD-10-CM

## 2020-11-06 DIAGNOSIS — R739 Hyperglycemia, unspecified: Secondary | ICD-10-CM

## 2020-11-06 LAB — POCT GLYCOSYLATED HEMOGLOBIN (HGB A1C): Hemoglobin A1C: 6.8 % — AB (ref 4.0–5.6)

## 2020-11-06 LAB — POCT GLUCOSE (DEVICE FOR HOME USE): POC Glucose: 136 mg/dl — AB (ref 70–99)

## 2020-11-06 MED ORDER — NOVOLOG FLEXPEN 100 UNIT/ML ~~LOC~~ SOPN: PEN_INJECTOR | SUBCUTANEOUS | 3 refills | Status: DC

## 2020-11-06 MED ORDER — LANTUS SOLOSTAR 100 UNIT/ML ~~LOC~~ SOPN: PEN_INJECTOR | SUBCUTANEOUS | 1 refills | Status: DC

## 2020-11-06 MED ORDER — ACCU-CHEK GUIDE VI STRP: ORAL_STRIP | 6 refills | Status: DC

## 2020-11-06 MED ORDER — DEXCOM G6 SENSOR MISC: 5 refills | Status: DC

## 2020-11-06 NOTE — Patient Instructions (Addendum)
Hypoglycemia  . Shaking or trembling. . Sweating and chills. . Dizziness or lightheadedness. . Faster heart rate. Marland Kitchen Headaches. . Hunger. . Nausea. . Nervousness or irritability. . Pale skin. Marland Kitchen Restless sleep. . Weakness. Kennis Carina vision. . Confusion or trouble concentrating. . Sleepiness. . Slurred speech. . Tingling or numbness in the face or mouth.  How do I treat an episode of hypoglycemia? The American Diabetes Association recommends the "15-15 rule" for an episode of hypoglycemia: . Eat or drink 15 grams of carbs to raise your blood sugar. . After 15 minutes, check your blood sugar. . If it's still below 70 mg/dL, have another 15 grams of carbs. . Repeat until your blood sugar is at least 70 mg/dL.  Hyperglycemia  . Frequent urination . Increased thirst . Blurred vision . Fatigue . Headache Diabetic Ketoacidosis (DKA)  If hyperglycemia goes untreated, it can cause toxic acids (ketones) to build up in your blood and urine (ketoacidosis). Signs and symptoms include: . Fruity-smelling breath . Nausea and vomiting . Shortness of breath . Dry mouth . Weakness . Confusion . Coma . Abdominal pain        Sick day/Ketones Protocol  . Check blood glucose every 2 hours  . Check urine ketones every 2 hours (until ketones are clear)  . Drink plenty of fluids (water, Pedialyte) hourly . Give rapid acting insulin correction dose every 3 hours until ketones are clear  . Notify clinic of sickness/ketones  . If you develop signs of DKA, go to ER immediately.   Hemoglobin A1c levels     - For insulin if you go to shots   - Lantus/Basaglar: 39 units   - Novolog: 1 unit for every 7 grams of carbs    - 1 unit for every 30 points above 120.

## 2020-11-06 NOTE — Progress Notes (Signed)
Pediatric Endocrinology Diabetes Consultation Follow-up Visit  Chelsey [name] [birth year] [id number]  Chief Complaint: Follow-up type 1 diabetes   Patient, No Pcp Per   HPI: Chelsey [name]  is a 19 y.o. female presenting for follow-up of type 1 diabetes. she is accompanied to this visit by her her mother, older brother and older sister. .  1. She presented to the ER on 10/26/2017 with 2 months of polyuria, polydipsia and weigh tloss. Her glucose was 547 on arrival to ER. She was admitted to PICU for IV insulin and fluids. Once stabilized she was transferred to the pediatric floor for diabetes education and was started on Basaglar and Novolog insulin.   2. Since her last visit to clinic on 07/2020, [name] has been generally healthy.  She did well in school, just finished. She is majoring in education. 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   She is using Tslim insulin pump and Dexcom CGm. Both have been working well. She plans to go to [place] for New Years and wants to take a pump vacation while there. She had problems with father losing insurance and has not been consistently wearing her CGM. He has new insurance now.   Concerns:  - traveling with diabetes supplies  - Going low during lunch when she boluses   Insulin regimen: TSlim insulin pump  Basal Rates 12AM 1.60  6am 1.65  11pm 1.60          Insulin to Carbohydrate Ratio 12AM 9  6am 6  9pm 6          Insulin Sensitivity Factor 12AM 40  6am 30  11pm 40          Target Blood Glucose 12AM 120                 Hypoglycemia: Able to feel low blood sugars.  No glucagon needed recently.  Insulin Pump and CGm download   Med-alert ID: Not currently wearing. Injection sites: arms, legs and abdomen. Annual labs due: Ordered.  Ophthalmology due: 10/2020    3. ROS: Greater than 10 systems reviewed with pertinent positives listed in HPI, otherwise neg. Constitutional: Sleeping well.  Good energy  Eyes: No changes in vision. No blurry vision.  Ears/Nose/Mouth/Throat: No difficulty swallowing. No neck pain  Cardiovascular: No palpitations or tachycardia.  Respiratory: No increased work of breathing. No SOB  Gastrointestinal: No constipation or diarrhea. No abdominal pain Genitourinary: No nocturia, no polyuria Musculoskeletal: No joint pain Neurologic: Normal sensation, no tremor Endocrine: No polydipsia.  No hyperpigmentation Psychiatric:. Denies depression and anxiety.   Past Medical History:   Past Medical History:  Diagnosis Date  . Diabetes mellitus without complication ([place])    Phreesia 08/07/2020  . Liver laceration 01/16/14   sledding accident    Medications:  Outpatient Encounter Medications as of 11/06/2020  Medication Sig  . insulin lispro (HUMALOG) 100 UNIT/ML injection GIVE UP TO 100 UNITS PER DAY VIA INSULIN PUMP  . Accu-Chek FastClix Lancets MISC Check sugar 8 x daily (Patient not taking: Reported on 05/08/2020)  . Blood Glucose Monitoring Suppl (ACCU-CHEK GUIDE) w/Device KIT 1 kit by Does not apply route as needed. (Patient not taking: Reported on 05/08/2020)  . Continuous Blood Gluc Sensor (DEXCOM G6 SENSOR) MISC Please change sensor every 10 days  . Continuous Blood Gluc Transmit (DEXCOM G6 TRANSMITTER) MISC USE AS DIRECTED  . Glucagon (BAQSIMI TWO PACK) 3 MG/DOSE POWD Place 3 mg into the nose as needed. (  Patient not taking: Reported on 05/08/2020)  . glucagon 1 MG injection Follow package directions for low blood sugar. (Patient not taking: Reported on 02/05/2020)  . glucose blood (ACCU-CHEK GUIDE) test strip Check glucose 6x daily  . ibuprofen (ADVIL,MOTRIN) 200 MG tablet Take 200 mg by mouth every 6 (six) hours as needed for headache. (Patient not taking: Reported on 05/08/2020)  . insulin aspart (NOVOLOG FLEXPEN) 100 UNIT/ML FlexPen Give up to 50 units per day when not using pump.  Marland Kitchen insulin glargine (LANTUS SOLOSTAR) 100 UNIT/ML Solostar Pen INJECT  UP TO 50 UNITS SUBCUTANEOUSLY PER DAY  . OneTouch Delica Lancets 70J MISC 1 Units by Does not apply route as directed. (Patient not taking: Reported on 05/08/2020)  . [DISCONTINUED] Continuous Blood Gluc Sensor (DEXCOM G6 SENSOR) MISC Please change sensor every 10 days  . [DISCONTINUED] glucose blood (ACCU-CHEK GUIDE) test strip Check glucose 6x daily (Patient not taking: Reported on 05/08/2020)  . [DISCONTINUED] Insulin Glargine (LANTUS SOLOSTAR) 100 UNIT/ML Solostar Pen INJECT UP TO 50 UNITS SUBCUTANEOUSLY PER DAY (Patient not taking: Reported on 05/08/2020)   No facility-administered encounter medications on file as of 11/06/2020.    Allergies: No Known Allergies  Surgical History: No past surgical history on file.  Family History:  Family History  Problem Relation Age of Onset  . Learning disabilities Mother   . Thyroid disease Maternal Grandmother   . Diabetes Maternal Grandfather   . Hyperlipidemia Maternal Grandfather   . Vision loss Maternal Grandfather   . Learning disabilities Cousin        mat cousin has ADHD     Social History: Lives with: Mother, older brother and older sister.  [employer]   Physical Exam:  Vitals:   11/06/20 1056  BP: 106/72  Pulse: 84  Weight: 166 lb 8 oz (75.5 kg)   BP 106/72   Pulse 84   Wt 166 lb 8 oz (75.5 kg)   BMI 34.86 kg/m  Body mass index: body mass index is 34.86 kg/m. Blood pressure percentiles are not available for patients who are 18 years or older.  Ht Readings from Last 3 Encounters:  02/05/20 4' 9.95" (1.472 m) (<1 %, Z= -2.47)*  01/11/20 _0  (1.473 m) (<1 %, Z= -2.45)*  11/06/19 4' 9.72" (1.466 m) (<1 %, Z= -2.56)*   * Growth percentiles are based on CDC (Girls, 2-20 Years) data.   Wt Readings from Last 3 Encounters:  11/06/20 166 lb 8 oz (75.5 kg) (90 %, Z= 1.30)*  08/07/20 169 lb 11.2 oz (77 kg) (92 %, Z= 1.38)*  05/08/20 171 lb 6.4 oz (77.7 kg) (92 %, Z= 1.44)*   * Growth percentiles are based on CDC  (Girls, 2-20 Years) data.    Physical Exam   General: Well developed, well nourished female in no acute distress.   Head: Normocephalic, atraumatic.   Eyes:  Pupils equal and round. EOMI.   Sclera white.  No eye drainage.   Ears/Nose/Mouth/Throat: Nares patent, no nasal drainage.  Normal dentition, mucous membranes moist.   Neck: supple, no cervical lymphadenopathy, no thyromegaly Cardiovascular: regular rate, normal S1/S2, no murmurs Respiratory: No increased work of breathing.  Lungs clear to auscultation bilaterally.  No wheezes. Abdomen: soft, nontender, nondistended. Normal bowel sounds.  No appreciable masses  Extremities: warm, well perfused, cap refill < 2 sec.   Musculoskeletal: Normal muscle mass.  Normal strength Skin: warm, dry.  No rash or lesions. Neurologic: alert and oriented, normal speech, no tremor  Labs: Last hemoglobin A1c:6.8% on 04/2020  Lab Results  Component Value Date   HGBA1C 6.8 (A) 11/06/2020     Assessment/Plan: Chelsey [name] is a 19 y.o. female with type 1 diabetes on insulin pump therapy. She is doing well with diabetes care and glucose control. Her hemoglobin A1c is stable at 6.8% which meets ADA goal of <7%.   1-3. type 1 diabetes mellitus in pediatric patient (HCC)/Hyperglycemia/ hypoglycemia  - Reviewed insulin pump and CGM download. Discussed trends and patterns.  - Rotate pump sites to prevent scar tissue.  - bolus 15 minutes prior to eating to limit blood sugar spikes.  - Reviewed carb counting and importance of accurate carb counting.  - Discussed signs and symptoms of hypoglycemia. Always have glucose available.  - POCT glucose and hemoglobin A1c  - Reviewed growth chart.   4. Adjustment reaction  - Discussed concerns.  - Advised when traveling she should keep all supplies with her. Take back up supplies. Advised not to go through air port scanners with dexcom or insulin pump.   5. Insulin pump titration  Basal Rates 12AM 1.60  6am  1.65  11pm 1.60          Insulin to Carbohydrate Ratio 12AM 9  6am 6  11am 7 (new)   11pm 8        Insulin Sensitivity Factor 12AM 40  6am 30  11pm 40    For injections during pump holiday  - 39 units of Lantus  - Novolog 1 unit for 7 grams of carbs   - 1 unit for 30 points over 120.   Follow-up: 3 months   >45 spent today reviewing the medical chart, counseling the patient/family, and documenting today's visit.  When a patient is on insulin, intensive monitoring of blood glucose levels is necessary to avoid hyperglycemia and hypoglycemia. Severe hyperglycemia/hypoglycemia can lead to hospital admissions and be life threatening.    Hermenia Bers,  FNP-C  Pediatric Specialist  [address] [place]  [place], [address]  Tele: [phone]

## 2020-12-03 ENCOUNTER — Other Ambulatory Visit (INDEPENDENT_AMBULATORY_CARE_PROVIDER_SITE_OTHER): Payer: Self-pay | Admitting: Family

## 2020-12-24 ENCOUNTER — Telehealth (INDEPENDENT_AMBULATORY_CARE_PROVIDER_SITE_OTHER): Payer: Self-pay | Admitting: Family

## 2020-12-24 ENCOUNTER — Other Ambulatory Visit (INDEPENDENT_AMBULATORY_CARE_PROVIDER_SITE_OTHER): Payer: Self-pay

## 2020-12-24 ENCOUNTER — Encounter (INDEPENDENT_AMBULATORY_CARE_PROVIDER_SITE_OTHER): Payer: Self-pay

## 2020-12-24 DIAGNOSIS — E1065 Type 1 diabetes mellitus with hyperglycemia: Secondary | ICD-10-CM

## 2020-12-24 MED ORDER — DEXCOM G6 TRANSMITTER MISC: 2 refills | Status: DC

## 2020-12-24 NOTE — Telephone Encounter (Signed)
  Who's calling (name and relationship to patient) : Dwayne (self)  Best contact number: [phone]  Provider they see: Gretchen Short  Reason for call: Patient states that [facility] told her she could pick up a Dexcom transmitter. I did not see anything in the chart and there is nothing up front for her. If we are able to leave a transmitter up front for her, please give her a call and let her know.    PRESCRIPTION REFILL ONLY  Name of prescription:  Pharmacy:

## 2020-12-24 NOTE — Telephone Encounter (Signed)
Sent MyChart message also

## 2020-12-24 NOTE — Telephone Encounter (Signed)
Returned patients call. Left vm with call back number. Patient can come get a transmitter. She had a prescription sent in October with 1 refill. She should have one at the pharmacy.

## 2021-02-03 ENCOUNTER — Ambulatory Visit (INDEPENDENT_AMBULATORY_CARE_PROVIDER_SITE_OTHER): Payer: Managed Care, Other (non HMO) | Admitting: "Endocrinology

## 2021-02-03 ENCOUNTER — Encounter (INDEPENDENT_AMBULATORY_CARE_PROVIDER_SITE_OTHER): Payer: Self-pay

## 2021-02-18 ENCOUNTER — Other Ambulatory Visit: Payer: Self-pay

## 2021-02-18 ENCOUNTER — Encounter (INDEPENDENT_AMBULATORY_CARE_PROVIDER_SITE_OTHER): Payer: Self-pay | Admitting: Family

## 2021-02-18 ENCOUNTER — Other Ambulatory Visit (INDEPENDENT_AMBULATORY_CARE_PROVIDER_SITE_OTHER): Payer: Self-pay

## 2021-02-18 ENCOUNTER — Ambulatory Visit (INDEPENDENT_AMBULATORY_CARE_PROVIDER_SITE_OTHER): Payer: Managed Care, Other (non HMO) | Admitting: Family

## 2021-02-18 VITALS — BP 110/70 | HR 70 | Wt 158.6 lb

## 2021-02-18 DIAGNOSIS — E10649 Type 1 diabetes mellitus with hypoglycemia without coma: Secondary | ICD-10-CM

## 2021-02-18 DIAGNOSIS — E1065 Type 1 diabetes mellitus with hyperglycemia: Secondary | ICD-10-CM

## 2021-02-18 DIAGNOSIS — R739 Hyperglycemia, unspecified: Secondary | ICD-10-CM

## 2021-02-18 DIAGNOSIS — Z9641 Presence of insulin pump (external) (internal): Secondary | ICD-10-CM | POA: Diagnosis not present

## 2021-02-18 LAB — POCT GLYCOSYLATED HEMOGLOBIN (HGB A1C): Hemoglobin A1C: 7.5 % — AB (ref 4.0–5.6)

## 2021-02-18 LAB — POCT GLUCOSE (DEVICE FOR HOME USE): POC Glucose: 254 mg/dl — AB (ref 70–99)

## 2021-02-18 MED ORDER — BAQSIMI TWO PACK 3 MG/DOSE NA POWD: 3.0000 mg | NASAL | 3 refills | Status: DC | PRN

## 2021-02-18 NOTE — Patient Instructions (Signed)

## 2021-02-18 NOTE — Progress Notes (Signed)
Pediatric Endocrinology Diabetes Consultation Follow-up Visit  Chelsey [name] 12-13-2000 [id number]  Chief Complaint: Follow-up type 1 diabetes   Patient, No Pcp Per   HPI: Chelsey [name]  is a 20 y.o. female presenting for follow-up of type 1 diabetes. she is accompanied to this visit by her her mother, older brother and older sister. .  1. She presented to the ER on 10/26/2017 with 2 months of polyuria, polydipsia and weigh tloss. Her glucose was 547 on arrival to ER. She was admitted to PICU for IV insulin and fluids. Once stabilized she was transferred to the pediatric floor for diabetes education and was started on Basaglar and Novolog insulin.   2. Since her last visit to clinic on 10/2020, Chelsey [name] has been generally healthy.  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   Has Tslim insulin pump. She stopped wearing her Dexcom CGM about 10 days ago because she ran out of sensors but will pick up refill today. She is rarely checking blood sugars. Feels like she has been consistently bolusing when she eats. Hypoglycemia is very rare.   Before she ran out of Dexcom sensors and was using Control IQ with pump she felt like her blood sugars were very, good  Insulin regimen: TSlim insulin pump  Basal Rates 12AM 1.60  6am 1.65  11pm 1.60          Insulin to Carbohydrate Ratio 12AM 9  6am 6  11am 7  9pm 6       Insulin Sensitivity Factor 12AM 40  6am 30  11pm 40          Target Blood Glucose 12AM 120                 Hypoglycemia: Able to feel low blood sugars.  No glucagon needed recently.  Insulin Pump and CGm download   Med-alert ID: Not currently wearing. Injection sites: arms, legs and abdomen. Annual labs due: 01/2020  Ophthalmology due: 10/2020    3. ROS: Greater than 10 systems reviewed with pertinent positives listed in HPI, otherwise neg. Constitutional: Sleeping well. 8 lbs weight loss.  Eyes: No changes in vision. No blurry vision.   Ears/Nose/Mouth/Throat: No difficulty swallowing. No neck pain  Cardiovascular: No palpitations or tachycardia.  Respiratory: No increased work of breathing. No SOB  Gastrointestinal: No constipation or diarrhea. No abdominal pain Genitourinary: No nocturia, no polyuria Musculoskeletal: No joint pain Neurologic: Normal sensation, no tremor Endocrine: No polydipsia.  No hyperpigmentation Psychiatric:. Denies depression and anxiety.   Past Medical History:   Past Medical History:  Diagnosis Date  . Diabetes mellitus without complication ([place])    Phreesia 08/07/2020  . Liver laceration 01/16/14   sledding accident    Medications:  Outpatient Encounter Medications as of 02/18/2021  Medication Sig  . insulin lispro (HUMALOG) 100 UNIT/ML injection GIVE UP TO 100 UNITS PER DAY VIA INSULIN PUMP  . Accu-Chek FastClix Lancets MISC Check sugar 8 x daily (Patient not taking: Reported on 05/08/2020)  . Blood Glucose Monitoring Suppl (ACCU-CHEK GUIDE) w/Device KIT 1 kit by Does not apply route as needed. (Patient not taking: Reported on 05/08/2020)  . Continuous Blood Gluc Sensor (DEXCOM G6 SENSOR) MISC Please change sensor every 10 days  . Continuous Blood Gluc Transmit (DEXCOM G6 TRANSMITTER) MISC USE AS DIRECTED  . glucagon 1 MG injection Follow package directions for low blood sugar. (Patient not taking: Reported on 02/05/2020)  . glucose blood (ACCU-CHEK GUIDE) test strip Check glucose 6x daily  .  ibuprofen (ADVIL,MOTRIN) 200 MG tablet Take 200 mg by mouth every 6 (six) hours as needed for headache. (Patient not taking: Reported on 05/08/2020)  . insulin glargine (LANTUS SOLOSTAR) 100 UNIT/ML Solostar Pen INJECT UP TO 50 UNITS SUBCUTANEOUSLY PER DAY (Patient not taking: Reported on 02/18/2021)  . insulin lispro (HUMALOG KWIKPEN) 100 UNIT/ML KwikPen INJECT UP TO 50 UNITS SUBCUTANEOUSLY EVERY DAY (Patient not taking: Reported on 02/18/2021)  . OneTouch Delica Lancets 63K MISC 1 Units by Does not apply  route as directed. (Patient not taking: Reported on 05/08/2020)  . [DISCONTINUED] Glucagon (BAQSIMI TWO PACK) 3 MG/DOSE POWD Place 3 mg into the nose as needed. (Patient not taking: Reported on 05/08/2020)   No facility-administered encounter medications on file as of 02/18/2021.    Allergies: No Known Allergies  Surgical History: No past surgical history on file.  Family History:  Family History  Problem Relation Age of Onset  . Learning disabilities Mother   . Thyroid disease Maternal Grandmother   . Diabetes Maternal Grandfather   . Hyperlipidemia Maternal Grandfather   . Vision loss Maternal Grandfather   . Learning disabilities Cousin        mat cousin has ADHD     Social History: Lives with: Mother, older brother and older sister.  [employer] at [employer]   Physical Exam:  Vitals:   02/18/21 1343  BP: 110/70  Pulse: 70  Weight: 158 lb 9.6 oz (71.9 kg)   BP 110/70 (BP Location: Right Arm, Patient Position: Sitting, Cuff Size: Normal)   Pulse 70   Wt 158 lb 9.6 oz (71.9 kg)   BMI 33.20 kg/m  Body mass index: body mass index is 33.2 kg/m. Growth percentile SmartLinks can only be used for patients less than 42 years old.  Ht Readings from Last 3 Encounters:  02/05/20 4' 9.95" (1.472 m) (<1 %, Z= -2.47)*  01/11/20 4' 10"  (1.473 m) (<1 %, Z= -2.45)*  11/06/19 4' 9.72" (1.466 m) (<1 %, Z= -2.56)*   * Growth percentiles are based on CDC (Girls, 2-20 Years) data.   Wt Readings from Last 3 Encounters:  02/18/21 158 lb 9.6 oz (71.9 kg)  11/06/20 166 lb 8 oz (75.5 kg) (90 %, Z= 1.30)*  08/07/20 169 lb 11.2 oz (77 kg) (92 %, Z= 1.38)*   * Growth percentiles are based on CDC (Girls, 2-20 Years) data.    Physical Exam   General: Well developed, well nourished female in no acute distress.  Head: Normocephalic, atraumatic.   Eyes:  Pupils equal and round. EOMI.   Sclera white.  No eye drainage.   Ears/Nose/Mouth/Throat: Nares patent, no nasal drainage.  Normal dentition,  mucous membranes moist.   Neck: supple, no cervical lymphadenopathy, no thyromegaly Cardiovascular: regular rate, normal S1/S2, no murmurs Respiratory: No increased work of breathing.  Lungs clear to auscultation bilaterally.  No wheezes. Abdomen: soft, nontender, nondistended. Normal bowel sounds.  No appreciable masses  Extremities: warm, well perfused, cap refill < 2 sec.   Musculoskeletal: Normal muscle mass.  Normal strength Skin: warm, dry.  No rash or lesions. Neurologic: alert and oriented, normal speech, no tremor   Labs: Last hemoglobin A1c:6.8% on 10/2020  Lab Results  Component Value Date   HGBA1C 7.5 (A) 02/18/2021     Assessment/Plan: Chelsey [name] is a 20 y.o. female with type 1 diabetes on insulin pump therapy. Has not consistently used Dexcom CGM which is leading to more hyperglycemia. Hemoglobin A1c has increased to 7.5% today which is higher then ADA  goal of <7%.   1-3. type 1 diabetes mellitus in pediatric patient (HCC)/Hyperglycemia/ hypoglycemia  - Reviewed insulin pump and CGM download. Discussed trends and patterns.  - Rotate pump sites to prevent scar tissue.  - bolus 15 minutes prior to eating to limit blood sugar spikes.  - Reviewed carb counting and importance of accurate carb counting.  - Discussed signs and symptoms of hypoglycemia. Always have glucose available.  - POCT glucose and hemoglobin A1c  - Reviewed growth chart.  - Stressed that with closed loop insulin pump therapy, it is importance that she always wearing CGM.  And when she is not wearing CGM she must check blood sugars at least 4 x per day.   5. Insulin pump titration  No changes. Pump in place.  For injections during pump holiday  - 39 units of Lantus  - Novolog 1 unit for 7 grams of carbs   - 1 unit for 30 points over 120.   Follow-up: 3 months   >35 spent today reviewing the medical chart, counseling the patient/family, and documenting today's visit.   When a patient is on insulin,  intensive monitoring of blood glucose levels is necessary to avoid hyperglycemia and hypoglycemia. Severe hyperglycemia/hypoglycemia can lead to hospital admissions and be life threatening.    Hermenia Bers,  FNP-C  Pediatric Specialist  [address] [place]  [place], [address]  Tele: [phone]

## 2021-02-24 ENCOUNTER — Other Ambulatory Visit (INDEPENDENT_AMBULATORY_CARE_PROVIDER_SITE_OTHER): Payer: Self-pay | Admitting: Family

## 2021-03-03 ENCOUNTER — Encounter (INDEPENDENT_AMBULATORY_CARE_PROVIDER_SITE_OTHER): Payer: Self-pay

## 2021-03-04 ENCOUNTER — Other Ambulatory Visit (INDEPENDENT_AMBULATORY_CARE_PROVIDER_SITE_OTHER): Payer: Self-pay | Admitting: *Deleted

## 2021-03-04 MED ORDER — INSULIN LISPRO 100 UNIT/ML ~~LOC~~ SOLN: SUBCUTANEOUS | 1 refills | Status: DC

## 2021-03-13 ENCOUNTER — Encounter (INDEPENDENT_AMBULATORY_CARE_PROVIDER_SITE_OTHER): Payer: Self-pay

## 2021-04-09 ENCOUNTER — Other Ambulatory Visit (INDEPENDENT_AMBULATORY_CARE_PROVIDER_SITE_OTHER): Payer: Self-pay

## 2021-04-09 DIAGNOSIS — E1065 Type 1 diabetes mellitus with hyperglycemia: Secondary | ICD-10-CM

## 2021-04-09 MED ORDER — DEXCOM G6 TRANSMITTER MISC: 2 refills | Status: DC

## 2021-04-09 MED ORDER — DEXCOM G6 SENSOR MISC: 5 refills | Status: DC

## 2021-05-25 ENCOUNTER — Telehealth (INDEPENDENT_AMBULATORY_CARE_PROVIDER_SITE_OTHER): Payer: Self-pay | Admitting: Family

## 2021-05-25 NOTE — Telephone Encounter (Signed)
  Who's calling (name and relationship to patient) : Chelsey [name] contact number: [phone]  Provider they see: Gretchen Short   Reason for call: Patient has appt tomorrow but she wanted to let Gretchen Short know her pump keeps stopping even with insulin in the cartridge. Patient said she is able to check her blood sugars and the pump is delivering insulin but she does not know how much she is receiving . Patient did get her dexcom refilled but is having a lot of trouble with the pump keeping shutting down. She would like a call back please to discuss      PRESCRIPTION REFILL ONLY  Name of prescription:  Pharmacy:

## 2021-05-25 NOTE — Telephone Encounter (Signed)
Called patient back, she is not having significant highs, no more than usual.  I asked if she has reached out to the customer support for her pump.  I explained that they may be able to help her troubleshoot the issue or decide if it needs to be placed.  She stated she has not but will call them.

## 2021-05-26 ENCOUNTER — Other Ambulatory Visit: Payer: Self-pay

## 2021-05-26 ENCOUNTER — Encounter (INDEPENDENT_AMBULATORY_CARE_PROVIDER_SITE_OTHER): Payer: Self-pay | Admitting: Family

## 2021-05-26 ENCOUNTER — Ambulatory Visit (INDEPENDENT_AMBULATORY_CARE_PROVIDER_SITE_OTHER): Payer: Managed Care, Other (non HMO) | Admitting: Family

## 2021-05-26 VITALS — BP 116/74 | HR 76 | Wt 171.6 lb

## 2021-05-26 DIAGNOSIS — E10649 Type 1 diabetes mellitus with hypoglycemia without coma: Secondary | ICD-10-CM

## 2021-05-26 DIAGNOSIS — E1065 Type 1 diabetes mellitus with hyperglycemia: Secondary | ICD-10-CM | POA: Diagnosis not present

## 2021-05-26 DIAGNOSIS — R739 Hyperglycemia, unspecified: Secondary | ICD-10-CM

## 2021-05-26 LAB — POCT GLYCOSYLATED HEMOGLOBIN (HGB A1C): Hemoglobin A1C: 6.9 % — AB (ref 4.0–5.6)

## 2021-05-26 LAB — POCT GLUCOSE (DEVICE FOR HOME USE): Glucose Fasting, POC: 321 mg/dL — AB (ref 70–99)

## 2021-05-26 NOTE — Patient Instructions (Addendum)
  Basal Rates 12AM 1.60  6am 1.65  11pm 1.60        Max basal: 2 units   Insulin to Carbohydrate Ratio 12AM 9  6am 6  11am 7  9pm 6     Max bolus 25 units   Insulin Sensitivity Factor 12AM 40  6am 30  11pm 40          Target Blood Glucose 12AM 110                Active insulin time 5 hours

## 2021-05-26 NOTE — Progress Notes (Signed)
Pediatric Endocrinology Diabetes Consultation Follow-up Visit  Chelsey [name] 11-10-2001 [id number]  Chief Complaint: Follow-up type 1 diabetes   Patient, No Pcp Per (Inactive)   HPI: Chelsey [name]  is a 20 y.o. female presenting for follow-up of type 1 diabetes. she is accompanied to this visit by her her mother, older brother and older sister. .  1. She presented to the ER on 10/26/2017 with 2 months of polyuria, polydipsia and weigh tloss. Her glucose was 547 on arrival to ER. She was admitted to PICU for IV insulin and fluids. Once stabilized she was transferred to the pediatric floor for diabetes education and was started on Basaglar and Novolog insulin.   2. Since her last visit to clinic on 10/2020, Chelsey [name] has been generally healthy.  Her insulin pump stopped working yesterday. She felt like the pump was not delivering insulin properly and was giving a "cartridge alarm". She has called to get a replacement pump which should be here later today. She took 35 units of Lantus last night.   She feels like her blood sugars have been good overall. There is some variability because when she works [employer] shifts her blood sugars will drop. She notices that she spikes with meals but it comes down to normal within 1-2 hours.   Insulin regimen: TSlim insulin pump  Basal Rates 12AM 1.60  6am 1.65  11pm 1.60          Insulin to Carbohydrate Ratio 12AM 9  6am 6  11am 7  9pm 6       Insulin Sensitivity Factor 12AM 40  6am 30  11pm 40          Target Blood Glucose 12AM 120                 Hypoglycemia: Able to feel low blood sugars.  No glucagon needed recently.  Insulin Pump and CGm download   - Checking 3-4 x per day   - Avg Bg 175.   - BG range 55-506  Med-alert ID: Not currently wearing. Injection sites: arms, legs and abdomen. Annual labs due: 01/2021--> ordered  Ophthalmology due: 10/2020    3. ROS: Greater than 10 systems reviewed with pertinent positives listed in  HPI, otherwise neg. Constitutional: Sleeping well.  Eyes: No changes in vision. No blurry vision.  Ears/Nose/Mouth/Throat: No difficulty swallowing. No neck pain  Cardiovascular: No palpitations or tachycardia.  Respiratory: No increased work of breathing. No SOB  Gastrointestinal: No constipation or diarrhea. No abdominal pain Genitourinary: No nocturia, no polyuria Musculoskeletal: No joint pain Neurologic: Normal sensation, no tremor Endocrine: No polydipsia.  No hyperpigmentation Psychiatric:. Denies depression and anxiety.   Past Medical History:   Past Medical History:  Diagnosis Date   Diabetes mellitus without complication ([place])    Phreesia 08/07/2020   Liver laceration 01/16/14   sledding accident    Medications:  Outpatient Encounter Medications as of 05/26/2021  Medication Sig   insulin lispro (HUMALOG) 100 UNIT/ML injection Use 300 units in insulin pump every 48 hours   Accu-Chek FastClix Lancets MISC Check sugar 8 x daily (Patient not taking: No sig reported)   Blood Glucose Monitoring Suppl (ACCU-CHEK GUIDE) w/Device KIT 1 kit by Does not apply route as needed. (Patient not taking: No sig reported)   Continuous Blood Gluc Sensor (DEXCOM G6 SENSOR) MISC Please change sensor every 10 days (Patient not taking: Reported on 05/26/2021)   Continuous Blood Gluc Transmit (DEXCOM G6 TRANSMITTER) MISC Change every 90 days (Patient not taking:  Reported on 05/26/2021)   Glucagon (BAQSIMI TWO PACK) 3 MG/DOSE POWD Place 3 mg into the nose as needed. (Patient not taking: Reported on 05/26/2021)   glucagon 1 MG injection Follow package directions for low blood sugar. (Patient not taking: Reported on 02/05/2020)   glucose blood (ACCU-CHEK GUIDE) test strip Check glucose 6x daily (Patient not taking: Reported on 05/26/2021)   ibuprofen (ADVIL,MOTRIN) 200 MG tablet Take 200 mg by mouth every 6 (six) hours as needed for headache. (Patient not taking: No sig reported)   insulin glargine (LANTUS  SOLOSTAR) 100 UNIT/ML Solostar Pen INJECT UP TO 50 UNITS SUBCUTANEOUSLY PER DAY (Patient not taking: No sig reported)   insulin lispro (HUMALOG KWIKPEN) 100 UNIT/ML KwikPen INJECT UP TO 50 UNITS SUBCUTANEOUSLY EVERY DAY (Patient not taking: No sig reported)   OneTouch Delica Lancets 03O MISC 1 Units by Does not apply route as directed. (Patient not taking: No sig reported)   No facility-administered encounter medications on file as of 05/26/2021.    Allergies: No Known Allergies  Surgical History: No past surgical history on file.  Family History:  Family History  Problem Relation Age of Onset   Learning disabilities Mother    Thyroid disease Maternal Grandmother    Diabetes Maternal Grandfather    Hyperlipidemia Maternal Grandfather    Vision loss Maternal Grandfather    Learning disabilities Cousin        mat cousin has ADHD     Social History: Lives with: Mother, older brother and older sister.  [employer] at [employer]   Physical Exam:  Vitals:   05/26/21 0916  BP: 116/74  Pulse: 76  Weight: 171 lb 9.6 oz (77.8 kg)    BP 116/74 (BP Location: Left Arm, Patient Position: Sitting, Cuff Size: Normal)   Pulse 76   Wt 171 lb 9.6 oz (77.8 kg)   BMI 35.92 kg/m  Body mass index: body mass index is 35.92 kg/m. Growth percentile SmartLinks can only be used for patients less than 70 years old.  Ht Readings from Last 3 Encounters:  02/05/20 4' 9.95" (1.472 m) (<1 %, Z= -2.47)*  01/11/20 _0  (1.473 m) (<1 %, Z= -2.45)*  11/06/19 4' 9.72" (1.466 m) (<1 %, Z= -2.56)*   * Growth percentiles are based on CDC (Girls, 2-20 Years) data.   Wt Readings from Last 3 Encounters:  05/26/21 171 lb 9.6 oz (77.8 kg)  02/18/21 158 lb 9.6 oz (71.9 kg)  11/06/20 166 lb 8 oz (75.5 kg) (90 %, Z= 1.30)*   * Growth percentiles are based on CDC (Girls, 2-20 Years) data.    Physical Exam   General: Well developed, well nourished female in no acute distress.  Head: Normocephalic, atraumatic.    Eyes:  Pupils equal and round. EOMI.   Sclera white.  No eye drainage.   Ears/Nose/Mouth/Throat: Nares patent, no nasal drainage.  Normal dentition, mucous membranes moist.   Neck: supple, no cervical lymphadenopathy, no thyromegaly Cardiovascular: regular rate, normal S1/S2, no murmurs Respiratory: No increased work of breathing.  Lungs clear to auscultation bilaterally.  No wheezes. Abdomen: soft, nontender, nondistended. Normal bowel sounds.  No appreciable masses  Extremities: warm, well perfused, cap refill < 2 sec.   Musculoskeletal: Normal muscle mass.  Normal strength Skin: warm, dry.  No rash or lesions. Neurologic: alert and oriented, normal speech, no tremor   Labs: Last hemoglobin A1c:7.5% on 01/2021 Lab Results  Component Value Date   HGBA1C 6.9 (A) 05/26/2021     Assessment/Plan: Chelsey [name]  is a 20 y.o. female with type 1 diabetes on insulin pump therapy. She has made improvements overall with diabetes care. Hemoglobin A1c has improved to 6.9% today. Unable to make changes due to broken insulin pump and not currently wearing CGm.   1-3. type 1 diabetes mellitus in pediatric patient (HCC)/Hyperglycemia/ hypoglycemia  - Reviewed insulin pump download. Discussed trends and patterns.  - Rotate pump sites to prevent scar tissue.  - bolus 15 minutes prior to eating to limit blood sugar spikes.  - Reviewed carb counting and importance of accurate carb counting.  - Discussed signs and symptoms of hypoglycemia. Always have glucose available.  - POCT glucose and hemoglobin A1c  - Reviewed growth chart.  - Gave settings for insulin pump when new pump arrives. Encouraged to contact clinic for assistance.  - Lipid panel, TFTs and microalbumin ordered   5. Insulin pump titration   For injections during pump holiday  - 39 units of Lantus  - Novolog 1 unit for 7 grams of carbs   - 1 unit for 30 points over 120.   Follow-up: 3 months   >45 spent today reviewing the medical  chart, counseling the patient/family, and documenting today's visit.   When a patient is on insulin, intensive monitoring of blood glucose levels is necessary to avoid hyperglycemia and hypoglycemia. Severe hyperglycemia/hypoglycemia can lead to hospital admissions and be life threatening.    Hermenia Bers,  FNP-C  Pediatric Specialist  [address] [place]  [place], [address]  Tele: [phone]

## 2021-05-27 LAB — LIPID PANEL
Cholesterol: 188 mg/dL (ref <200)
HDL: 70 mg/dL (ref >=50)
LDL Cholesterol (Calc): 104 mg/dL (calc) — ABNORMAL HIGH
Non-HDL Cholesterol (Calc): 118 mg/dL (calc) (ref <130)
Total CHOL/HDL Ratio: 2.7 (calc) (ref <5.0)
Triglycerides: 48 mg/dL (ref <150)

## 2021-05-27 LAB — T4, FREE: Free T4: 1.1 ng/dL (ref 0.8–1.4)

## 2021-05-27 LAB — MICROALBUMIN / CREATININE URINE RATIO
Creatinine, Urine: 66 mg/dL (ref 20–275)
Microalb, Ur: <0.2 mg/dL

## 2021-05-27 LAB — TSH: TSH: 4.07 mIU/L

## 2021-05-28 ENCOUNTER — Encounter (INDEPENDENT_AMBULATORY_CARE_PROVIDER_SITE_OTHER): Payer: Self-pay

## 2021-05-29 ENCOUNTER — Other Ambulatory Visit: Payer: Self-pay

## 2021-05-29 ENCOUNTER — Ambulatory Visit (INDEPENDENT_AMBULATORY_CARE_PROVIDER_SITE_OTHER): Payer: Managed Care, Other (non HMO) | Admitting: Pharmacist

## 2021-05-29 DIAGNOSIS — E109 Type 1 diabetes mellitus without complications: Secondary | ICD-10-CM

## 2021-05-29 NOTE — Progress Notes (Signed)
This is a Pediatric Specialist virtual follow up consult provided via telephone. Chelsey [name] consented to an telephone visit consult today.  Location of patient: Chelsey [name] is at home. Location of provider: Zachery Conch, PharmD, CPP, CDCES is at office.   I connected with Chelsey [name]'s on 05/29/2021 by telephone and verified that I am speaking with the correct person using two identifiers. Her insulin pump recently broke and she received a new one in the mail. She last gave Lantus last night 1:08 AM.   Tandem Pump Settings (per most recent note with Gretchen Short, NP, on 05/26/21)  Basal Rates 12AM 1.60  6am 1.65  11pm 1.60            Max basal: 2 units/hr    Insulin to Carbohydrate Ratio 12AM 9  6am 6  11am 7  9pm 6       Max bolus 25 units    Insulin Sensitivity Factor 12AM 40  6am 30  11pm 40              Target Blood Glucose 12AM 110                      Active insulin time 5 hours   Assessment/Plan Assisted patient with restarting pump (connecting Dexcom G6 CGM, connecting pump to t:connect, and entering pump settings (basal programs, turned control IQ on, changed screen time out and date/time, initiated sleep schedules). Since she last administered Lantus ~1AM, advised her to turn off control IQ for now and turn temp basal rate of 0%. She will turn off temp basal rate/turn on control IQ ~8pm tonight. Advised her to contact me with any further issues/concerns. Also, discussed contacting on call provider for emergencies.  Emailed patient instructions ([email or web address])  Hi!  So after you do a pump site change, please follow these instructions:  1,2,3  Options Activity  Temp Rate Set temp rate 0%, duration 8 hours Set a reminder ~8pm on your cell phone to turn off temp basal rate and turn on control IQ To turn off temp basal rate: 1,2,3  options  activity  turn off temp basal rate To turn on control IQ: 1,2,3  options  my pump  control IQ  (total insulin dose: 75 units, weight: 171 lbs) To make sure control IQ is on there should be a grey diamond underneath the battery on the home screen   Thanks!  Zachery Conch, PharmD, CPP, CDCES Clinical Pharmacist and Diabetes Educator   [facility]  [address] [place], [place] [address] Phone: [phone]; Fax: [phone]   This appointment required 31 minutes of patient care (this includes precharting, chart review, review of results, virtual care, etc.).  Time spent since initial appt on 05/29/2021: 31 minutes   Thank you for involving clinical pharmacist/diabetes educator to assist in providing this patient's care.   Zachery Conch, PharmD, CPP, CDCES

## 2021-06-29 ENCOUNTER — Encounter (INDEPENDENT_AMBULATORY_CARE_PROVIDER_SITE_OTHER): Payer: Self-pay

## 2021-08-06 ENCOUNTER — Encounter (INDEPENDENT_AMBULATORY_CARE_PROVIDER_SITE_OTHER): Payer: Self-pay

## 2021-08-09 ENCOUNTER — Encounter (HOSPITAL_BASED_OUTPATIENT_CLINIC_OR_DEPARTMENT_OTHER): Payer: Self-pay

## 2021-08-26 ENCOUNTER — Ambulatory Visit (INDEPENDENT_AMBULATORY_CARE_PROVIDER_SITE_OTHER): Payer: Managed Care, Other (non HMO) | Admitting: Family

## 2021-08-27 ENCOUNTER — Other Ambulatory Visit: Payer: Self-pay

## 2021-08-27 ENCOUNTER — Encounter (INDEPENDENT_AMBULATORY_CARE_PROVIDER_SITE_OTHER): Payer: Self-pay | Admitting: Family

## 2021-08-27 ENCOUNTER — Ambulatory Visit (INDEPENDENT_AMBULATORY_CARE_PROVIDER_SITE_OTHER): Payer: Managed Care, Other (non HMO) | Admitting: Family

## 2021-08-27 VITALS — BP 122/74 | HR 76 | Wt 171.2 lb

## 2021-08-27 DIAGNOSIS — E109 Type 1 diabetes mellitus without complications: Secondary | ICD-10-CM

## 2021-08-27 DIAGNOSIS — Z23 Encounter for immunization: Secondary | ICD-10-CM

## 2021-08-27 DIAGNOSIS — Z4681 Encounter for fitting and adjustment of insulin pump: Secondary | ICD-10-CM

## 2021-08-27 LAB — POCT GLYCOSYLATED HEMOGLOBIN (HGB A1C): Hemoglobin A1C: 6.9 % — AB (ref 4.0–5.6)

## 2021-08-27 LAB — POCT GLUCOSE (DEVICE FOR HOME USE): POC Glucose: 134 mg/dl — AB (ref 70–99)

## 2021-08-27 NOTE — Progress Notes (Signed)
Pediatric Endocrinology Diabetes Consultation Follow-up Visit  Chelsey [name] 05-16-2001 [id number]  Chief Complaint: Follow-up type 1 diabetes   Patient, No Pcp Per (Inactive)   HPI: Chelsey [name]  is a 20 y.o. female presenting for follow-up of type 1 diabetes. she is accompanied to this visit by her her mother, older brother and older sister. .  1. She presented to the ER on 10/26/2017 with 2 months of polyuria, polydipsia and weigh tloss. Her glucose was 547 on arrival to ER. She was admitted to PICU for IV insulin and fluids. Once stabilized she was transferred to the pediatric floor for diabetes education and was started on Basaglar and Novolog insulin.   2. Since her last visit to clinic on 04/2021 [name] has been generally healthy.  She recently got her first diabetes tattoo, it is a medical alert tattoo. 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   Using Tslim insulin pump and Dexcom CGM. She has had a few pump site failures recently. Reports estimating carbs accurately and consistently. Occasionally forgets to bolus when she is busy with school. Hypoglycemia is rare.   Concern:  - Received supplies from [employer] with bent cannulas.  - Blood sugars staying high after meals.    Insulin regimen: TSlim insulin pump  Basal Rates 12AM 1.60  6am 1.65  11pm 1.60          Insulin to Carbohydrate Ratio 12AM 9  6am 6  11am 7  9pm 6       Insulin Sensitivity Factor 12AM 40  6am 30  11pm 40          Target Blood Glucose 12AM 120                 Hypoglycemia: Able to feel low blood sugars.  No glucagon needed recently.  Insulin Pump and CGm download   Med-alert ID: Not currently wearing. Injection sites: arms, legs and abdomen. Annual labs due: 04/2022--> ordered  Ophthalmology due: 10/2020    3. ROS: Greater than 10 systems reviewed with pertinent positives listed in HPI, otherwise neg. Constitutional: Sleeping well. Weight stable.   Eyes: No changes in vision. No blurry vision.  Ears/Nose/Mouth/Throat: No difficulty swallowing. No neck pain  Cardiovascular: No palpitations or tachycardia.  Respiratory: No increased work of breathing. No SOB  Gastrointestinal: No constipation or diarrhea. No abdominal pain Genitourinary: No nocturia, no polyuria Musculoskeletal: No joint pain Neurologic: Normal sensation, no tremor Endocrine: No polydipsia.  No hyperpigmentation Psychiatric:. Denies depression and anxiety.   Past Medical History:   Past Medical History:  Diagnosis Date   Diabetes mellitus without complication ([place])    Phreesia 08/07/2020   Liver laceration 01/16/14   sledding accident    Medications:  Outpatient Encounter Medications as of 08/27/2021  Medication Sig   Accu-Chek FastClix Lancets MISC Check sugar 8 x daily   Blood Glucose Monitoring Suppl (ACCU-CHEK GUIDE) w/Device KIT 1 kit by Does not apply route as needed.   Continuous Blood Gluc Sensor (DEXCOM G6 SENSOR) MISC Please change sensor every 10 days   Continuous Blood Gluc Transmit (DEXCOM G6 TRANSMITTER) MISC Change every 90 days   insulin lispro (HUMALOG) 100 UNIT/ML injection Use 300 units in insulin pump every 48 hours   Omeprazole Magnesium (PRILOSEC PO) Take by mouth.   OneTouch Delica Lancets 56C MISC 1 Units by Does not apply route as directed.   Glucagon (BAQSIMI TWO PACK) 3 MG/DOSE POWD Place 3 mg into the nose as  needed. (Patient not taking: No sig reported)   glucagon 1 MG injection Follow package directions for low blood sugar. (Patient not taking: Reported on 02/05/2020)   glucose blood (ACCU-CHEK GUIDE) test strip Check glucose 6x daily (Patient not taking: Reported on 08/27/2021)   ibuprofen (ADVIL,MOTRIN) 200 MG tablet Take 200 mg by mouth every 6 (six) hours as needed for headache. (Patient not taking: Reported on 08/27/2021)   insulin glargine (LANTUS SOLOSTAR) 100 UNIT/ML Solostar Pen INJECT UP TO 50 UNITS SUBCUTANEOUSLY PER DAY  (Patient not taking: Reported on 08/27/2021)   insulin lispro (HUMALOG KWIKPEN) 100 UNIT/ML KwikPen INJECT UP TO 50 UNITS SUBCUTANEOUSLY EVERY DAY (Patient not taking: No sig reported)   No facility-administered encounter medications on file as of 08/27/2021.    Allergies: No Known Allergies  Surgical History: History reviewed. No pertinent surgical history.  Family History:  Family History  Problem Relation Age of Onset   Learning disabilities Mother    Thyroid disease Maternal Grandmother    Diabetes Maternal Grandfather    Hyperlipidemia Maternal Grandfather    Vision loss Maternal Grandfather    Learning disabilities Cousin        mat cousin has ADHD     Social History: Lives with: Mother, older brother and older sister.  [employer] at [employer]   Physical Exam:  Vitals:   08/27/21 1116  BP: 122/74  Pulse: 76  Weight: 171 lb 3.2 oz (77.7 kg)     BP 122/74   Pulse 76   Wt 171 lb 3.2 oz (77.7 kg)   LMP 08/04/2021   BMI 35.84 kg/m  Body mass index: body mass index is 35.84 kg/m. Growth percentile SmartLinks can only be used for patients less than 56 years old.  Ht Readings from Last 3 Encounters:  02/05/20 4' 9.95" (1.472 m) (<1 %, Z= -2.47)*  01/11/20 4' 10"  (1.473 m) (<1 %, Z= -2.45)*  11/06/19 4' 9.72" (1.466 m) (<1 %, Z= -2.56)*   * Growth percentiles are based on CDC (Girls, 2-20 Years) data.   Wt Readings from Last 3 Encounters:  08/27/21 171 lb 3.2 oz (77.7 kg)  05/26/21 171 lb 9.6 oz (77.8 kg)  02/18/21 158 lb 9.6 oz (71.9 kg)    Physical Exam  General: Well developed, well nourished female in no acute distress.  Head: Normocephalic, atraumatic.   Eyes:  Pupils equal and round. EOMI.   Sclera white.  No eye drainage.   Ears/Nose/Mouth/Throat: Nares patent, no nasal drainage.  Normal dentition, mucous membranes moist.   Neck: supple, no cervical lymphadenopathy, no thyromegaly Cardiovascular: regular rate, normal S1/S2, no murmurs Respiratory: No  increased work of breathing.  Lungs clear to auscultation bilaterally.  No wheezes. Abdomen: soft, nontender, nondistended. Normal bowel sounds.  No appreciable masses  Extremities: warm, well perfused, cap refill < 2 sec.   Musculoskeletal: Normal muscle mass.  Normal strength Skin: warm, dry.  No rash or lesions. Neurologic: alert and oriented, normal speech, no tremor    Labs: Last hemoglobin A1c:6.9% on 04/2021 Lab Results  Component Value Date   HGBA1C 6.9 (A) 08/27/2021     Assessment/Plan: Chelsey [name] is a 20 y.o. female with type 1 diabetes on insulin pump therapy. Having a pattern of hyperglycemia between 11am-8pm, usually after eating. She needs stronger carb ratio. Hemoglobin A1c is stable at 6.9% which meets ADA goal of <7.5%. Discussed importance of annual eye exam.   1-3. type 1 diabetes mellitus in pediatric patient (HCC)/Hyperglycemia/ hypoglycemia  - Reviewed insulin pump  and CGM download. Discussed trends and patterns.  - Rotate pump sites to prevent scar tissue.  - bolus 15 minutes prior to eating to limit blood sugar spikes.  - Reviewed carb counting and importance of accurate carb counting.  - Discussed signs and symptoms of hypoglycemia. Always have glucose available.  - POCT glucose and hemoglobin A1c  - Reviewed growth chart.    5. Insulin pump titration  Insulin to Carbohydrate Ratio 12AM 9  6am 6  11am 7--> 6   9pm 6--> 5       For injections during pump holiday  - 39 units of Lantus  - Novolog 1 unit for 7 grams of carbs   - 1 unit for 30 points over 120.    INFLUENZA vaccine given. Counseling provided.  Follow-up: 3 months    >45 spent today reviewing the medical chart, counseling the patient/family, and documenting today's visit.  When a patient is on insulin, intensive monitoring of blood glucose levels is necessary to avoid hyperglycemia and hypoglycemia. Severe hyperglycemia/hypoglycemia can lead to hospital admissions and be life  threatening.    Hermenia Bers,  FNP-C  Pediatric Specialist  [address] [place]  [place], [address]  Tele: [phone]

## 2021-08-27 NOTE — Patient Instructions (Addendum)
It was a pleasure seeing you in clinic today. Please do not hesitate to contact me if you have questions or concerns.   Please sign up for MyChart. This is a communication tool that allows you to send an email directly to me. This can be used for questions, prescriptions and blood sugar reports. We will also release labs to you with instructions on MyChart. Please do not use MyChart if you need immediate or emergency assistance. Ask our wonderful front office staff if you need assistance.   Insulin to Carbohydrate Ratio 12AM 9  6am 6  11am 7--> 6   9pm 6--> 5

## 2021-09-13 ENCOUNTER — Encounter (INDEPENDENT_AMBULATORY_CARE_PROVIDER_SITE_OTHER): Payer: Self-pay

## 2021-09-13 DIAGNOSIS — E109 Type 1 diabetes mellitus without complications: Secondary | ICD-10-CM

## 2021-09-14 ENCOUNTER — Encounter (INDEPENDENT_AMBULATORY_CARE_PROVIDER_SITE_OTHER): Payer: Self-pay

## 2021-09-14 MED ORDER — HUMALOG 100 UNIT/ML ~~LOC~~ SOLN
SUBCUTANEOUS | 1 refills | Status: DC
Start: 2021-09-14 — End: 2021-09-23

## 2021-09-23 ENCOUNTER — Other Ambulatory Visit (INDEPENDENT_AMBULATORY_CARE_PROVIDER_SITE_OTHER): Payer: Self-pay | Admitting: Family

## 2021-09-23 DIAGNOSIS — E109 Type 1 diabetes mellitus without complications: Secondary | ICD-10-CM

## 2021-10-01 ENCOUNTER — Ambulatory Visit: Payer: Self-pay | Admitting: Medical-Surgical

## 2021-11-06 ENCOUNTER — Encounter (INDEPENDENT_AMBULATORY_CARE_PROVIDER_SITE_OTHER): Payer: Self-pay | Admitting: Pediatric Endocrinology

## 2021-11-06 ENCOUNTER — Telehealth (INDEPENDENT_AMBULATORY_CARE_PROVIDER_SITE_OTHER): Payer: Self-pay | Admitting: Family

## 2021-11-06 NOTE — Telephone Encounter (Signed)
Who's calling (name and relationship to patient) : Chelsey [name]   Best contact number: [phone]  Provider they see: Gretchen Short  Reason for call: Patient would like to know the setting for her pump. She would like them sent through [facility] because she is currently at work and unable to answer the phone.   Call ID:      PRESCRIPTION REFILL ONLY  Name of prescription:  Pharmacy:

## 2021-11-06 NOTE — Telephone Encounter (Signed)
My chart message sent to patient.   Also advised patient that she can access her medical records through MyChart and her most recent pump settings through [employer].   Dessa Phi, MD

## 2021-12-07 ENCOUNTER — Ambulatory Visit (INDEPENDENT_AMBULATORY_CARE_PROVIDER_SITE_OTHER): Payer: Managed Care, Other (non HMO) | Admitting: Family

## 2021-12-08 ENCOUNTER — Ambulatory Visit (INDEPENDENT_AMBULATORY_CARE_PROVIDER_SITE_OTHER): Payer: Managed Care, Other (non HMO) | Admitting: Family

## 2021-12-10 ENCOUNTER — Other Ambulatory Visit: Payer: Self-pay

## 2021-12-10 ENCOUNTER — Encounter (INDEPENDENT_AMBULATORY_CARE_PROVIDER_SITE_OTHER): Payer: Self-pay

## 2021-12-10 DIAGNOSIS — E1065 Type 1 diabetes mellitus with hyperglycemia: Secondary | ICD-10-CM

## 2021-12-10 MED ORDER — DEXCOM G6 SENSOR MISC
5 refills | Status: DC
Start: 2021-12-10 — End: 2022-06-07

## 2021-12-10 MED ORDER — DEXCOM G6 TRANSMITTER MISC: 2 refills | Status: DC

## 2021-12-17 ENCOUNTER — Other Ambulatory Visit: Payer: Self-pay

## 2021-12-17 ENCOUNTER — Encounter (INDEPENDENT_AMBULATORY_CARE_PROVIDER_SITE_OTHER): Payer: Self-pay | Admitting: Family

## 2021-12-17 ENCOUNTER — Ambulatory Visit (INDEPENDENT_AMBULATORY_CARE_PROVIDER_SITE_OTHER): Payer: Managed Care, Other (non HMO) | Admitting: Family

## 2021-12-17 ENCOUNTER — Other Ambulatory Visit (INDEPENDENT_AMBULATORY_CARE_PROVIDER_SITE_OTHER): Payer: Self-pay | Admitting: Family

## 2021-12-17 VITALS — BP 116/74 | HR 112 | Wt 178.0 lb

## 2021-12-17 DIAGNOSIS — E1065 Type 1 diabetes mellitus with hyperglycemia: Secondary | ICD-10-CM | POA: Diagnosis not present

## 2021-12-17 DIAGNOSIS — Z4681 Encounter for fitting and adjustment of insulin pump: Secondary | ICD-10-CM | POA: Diagnosis not present

## 2021-12-17 LAB — POCT GLYCOSYLATED HEMOGLOBIN (HGB A1C): Hemoglobin A1C: 6.7 % — AB (ref 4.0–5.6)

## 2021-12-17 LAB — POCT GLUCOSE (DEVICE FOR HOME USE): POC Glucose: 163 mg/dl — AB (ref 70–99)

## 2021-12-17 MED ORDER — LANTUS SOLOSTAR 100 UNIT/ML ~~LOC~~ SOPN: PEN_INJECTOR | SUBCUTANEOUS | 1 refills | Status: DC

## 2021-12-17 MED ORDER — INSULIN LISPRO (1 UNIT DIAL) 100 UNIT/ML (KWIKPEN): PEN_INJECTOR | SUBCUTANEOUS | 2 refills | Status: DC

## 2021-12-17 NOTE — Patient Instructions (Addendum)
It was a pleasure seeing you in clinic today. Please do not hesitate to contact me if you have questions or concerns.   Please sign up for MyChart. This is a communication tool that allows you to send an email directly to me. This can be used for questions, prescriptions and blood sugar reports. We will also release labs to you with instructions on MyChart. Please do not use MyChart if you need immediate or emergency assistance. Ask our wonderful front office staff if you need assistance.    For injections during pump holiday  - 39 units of Lantus  - Novolog 1 unit for 7 grams of carbs   - 1 unit for 30 points over 120.   Basal Rates 12AM 1.60--> 1.52  6am 9am 1.65--> 1.55  1.65  11pm 1.60

## 2021-12-17 NOTE — Progress Notes (Signed)
Pediatric Endocrinology Diabetes Consultation Follow-up Visit  Chelsey [name] Sep 11, 2001 [id number]  Chief Complaint: Follow-up type 1 diabetes   Patient, No Pcp Per (Inactive)   HPI: Chelsey [name]  is a 21 y.o. female presenting for follow-up of type 1 diabetes. she is accompanied to this visit by her her mother, older brother and older sister. .  1. She presented to the ER on 10/26/2017 with 2 months of polyuria, polydipsia and weigh tloss. Her glucose was 547 on arrival to ER. She was admitted to PICU for IV insulin and fluids. Once stabilized she was transferred to the pediatric floor for diabetes education and was started on Basaglar and Novolog insulin.   2. Since her last visit to clinic on 07/2021 [name] has been generally healthy.  She started back to school recently, reports things are a little easier this semester. 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   Reports that she has started exercising for 20 minutes per day since the new year. She also had period without sensors for Dexcom CGM. Her blood sugars have been well balanced but she feels like she goes low when she wakes up now. She usually notices blood sugars start to drop around 6am.   She recently got a new Tslim insulin pump because it was malfunctioning/shutting down.    Insulin regimen: TSlim insulin pump  Basal Rates 12AM 1.60  6am 1.65  9pm 1.60          Insulin to Carbohydrate Ratio 12AM 9  6am 6  11am 6  9pm 5       Insulin Sensitivity Factor 12AM 40  6am 30  11pm 40          Target Blood Glucose 12AM 120                 Hypoglycemia: Able to feel low blood sugars.  No glucagon needed recently.  Insulin Pump and CGm download  Med-alert ID: Not currently wearing. Injection sites: arms, legs and abdomen. Annual labs due: 04/2022--> ordered  Ophthalmology due: 10/2020    3. ROS: Greater than 10 systems reviewed with pertinent positives listed in HPI, otherwise  neg. Constitutional: Sleeping well. Weight stable.  Eyes: No changes in vision. No blurry vision.  Ears/Nose/Mouth/Throat: No difficulty swallowing. No neck pain  Cardiovascular: No palpitations or tachycardia.  Respiratory: No increased work of breathing. No SOB  Gastrointestinal: No constipation or diarrhea. No abdominal pain Genitourinary: No nocturia, no polyuria Musculoskeletal: No joint pain Neurologic: Normal sensation, no tremor Endocrine: No polydipsia.  No hyperpigmentation Psychiatric:. Denies depression and anxiety.   Past Medical History:   Past Medical History:  Diagnosis Date   Diabetes mellitus without complication ([place])    Phreesia 08/07/2020   Liver laceration 01/16/14   sledding accident    Medications:  Outpatient Encounter Medications as of 12/17/2021  Medication Sig   Accu-Chek FastClix Lancets MISC Check sugar 8 x daily   Blood Glucose Monitoring Suppl (ACCU-CHEK GUIDE) w/Device KIT 1 kit by Does not apply route as needed.   Continuous Blood Gluc Sensor (DEXCOM G6 SENSOR) MISC Please change sensor every 10 days   Continuous Blood Gluc Transmit (DEXCOM G6 TRANSMITTER) MISC Change every 90 days   Glucagon (BAQSIMI TWO PACK) 3 MG/DOSE POWD Place 3 mg into the nose as needed.   insulin lispro (HUMALOG) 100 UNIT/ML injection USE 300 UNITS IN INSULIN PUMP EVERY 48 HOURS   Omeprazole Magnesium (PRILOSEC PO) Take by mouth.   OneTouch Delica Lancets 76A MISC  1 Units by Does not apply route as directed.   glucagon 1 MG injection Follow package directions for low blood sugar. (Patient not taking: Reported on 02/05/2020)   glucose blood (ACCU-CHEK GUIDE) test strip Check glucose 6x daily (Patient not taking: Reported on 08/27/2021)   ibuprofen (ADVIL,MOTRIN) 200 MG tablet Take 200 mg by mouth every 6 (six) hours as needed for headache. (Patient not taking: Reported on 08/27/2021)   insulin glargine (LANTUS SOLOSTAR) 100 UNIT/ML Solostar Pen INJECT UP TO 50 UNITS  SUBCUTANEOUSLY PER DAY if pump fails.   insulin lispro (HUMALOG KWIKPEN) 100 UNIT/ML KwikPen INJECT UP TO 50 UNITS SUBCUTANEOUSLY EVERY DAY if pump fails.   [DISCONTINUED] insulin glargine (LANTUS SOLOSTAR) 100 UNIT/ML Solostar Pen INJECT UP TO 50 UNITS SUBCUTANEOUSLY PER DAY (Patient not taking: Reported on 08/27/2021)   [DISCONTINUED] insulin lispro (HUMALOG KWIKPEN) 100 UNIT/ML KwikPen INJECT UP TO 50 UNITS SUBCUTANEOUSLY EVERY DAY (Patient not taking: Reported on 02/18/2021)   No facility-administered encounter medications on file as of 12/17/2021.    Allergies: No Known Allergies  Surgical History: No past surgical history on file.  Family History:  Family History  Problem Relation Age of Onset   Learning disabilities Mother    Thyroid disease Maternal Grandmother    Diabetes Maternal Grandfather    Hyperlipidemia Maternal Grandfather    Vision loss Maternal Grandfather    Learning disabilities Cousin        mat cousin has ADHD     Social History: Lives with: Mother, older brother and older sister.  [employer] at [employer]   Physical Exam:  Vitals:   12/17/21 1110  BP: 116/74  Pulse: (!) 112  Weight: 178 lb (80.7 kg)      BP 116/74 (BP Location: Left Arm, Patient Position: Sitting, Cuff Size: Normal)    Pulse (!) 112    Wt 178 lb (80.7 kg)    BMI 37.26 kg/m  Body mass index: body mass index is 37.26 kg/m. Growth percentile SmartLinks can only be used for patients less than 48 years old.  Ht Readings from Last 3 Encounters:  02/05/20 4' 9.95" (1.472 m) (<1 %, Z= -2.47)*  01/11/20 4' 10"  (1.473 m) (<1 %, Z= -2.45)*  11/06/19 4' 9.72" (1.466 m) (<1 %, Z= -2.56)*   * Growth percentiles are based on CDC (Girls, 2-20 Years) data.   Wt Readings from Last 3 Encounters:  12/17/21 178 lb (80.7 kg)  08/27/21 171 lb 3.2 oz (77.7 kg)  05/26/21 171 lb 9.6 oz (77.8 kg)    Physical Exam  General: Well developed, well nourished female in no acute distress.   Head:  Normocephalic, atraumatic.   Eyes:  Pupils equal and round. EOMI.   Sclera white.  No eye drainage.   Ears/Nose/Mouth/Throat: Nares patent, no nasal drainage.  Normal dentition, mucous membranes moist.   Neck: supple, no cervical lymphadenopathy, no thyromegaly Cardiovascular: regular rate, normal S1/S2, no murmurs Respiratory: No increased work of breathing.  Lungs clear to auscultation bilaterally.  No wheezes. Abdomen: soft, nontender, nondistended. Normal bowel sounds.  No appreciable masses  Extremities: warm, well perfused, cap refill < 2 sec.   Musculoskeletal: Normal muscle mass.  Normal strength Skin: warm, dry.  No rash or lesions. Neurologic: alert and oriented, normal speech, no tremor    Labs: Last hemoglobin A1c:6.9% on 04/2021 Lab Results  Component Value Date   HGBA1C 6.7 (A) 12/17/2021     Assessment/Plan: Chelsey [name] is a 21 y.o. female with type 1 diabetes on insulin  pump therapy. Reports having a pattern of hypoglycemia in the morning. Unable to download pump due to new pump/not having account set up. Hemoglobin A1c is 6.7% today which meets ADA goal of <7%.    1-3. type 1 diabetes mellitus in pediatric patient (HCC)/Hyperglycemia/ hypoglycemia  - Reviewed insulin pump and CGM download. Discussed trends and patterns.  - Rotate pump sites to prevent scar tissue.  - bolus 15 minutes prior to eating to limit blood sugar spikes.  - Reviewed carb counting and importance of accurate carb counting.  - Discussed signs and symptoms of hypoglycemia. Always have glucose available.  - POCT glucose and hemoglobin A1c  - Reviewed growth chart.  - Help set up Tslim account in clinic.   5. Insulin pump titration  Basal Rates 12AM 1.60--> 1.52  6am 9am 1.65--> 1.55  1.65  11pm 1.60          For injections during pump holiday  - 39 units of Lantus  - Novolog 1 unit for 7 grams of carbs   - 1 unit for 30 points over 120.    Follow-up: 3 months    >45 spent today  reviewing the medical chart, counseling the patient/family, and documenting today's visit.  When a patient is on insulin, intensive monitoring of blood glucose levels is necessary to avoid hyperglycemia and hypoglycemia. Severe hyperglycemia/hypoglycemia can lead to hospital admissions and be life threatening.    Hermenia Bers,  FNP-C  Pediatric Specialist  [address] [place]  [place], [address]  Tele: [phone]

## 2022-01-28 ENCOUNTER — Ambulatory Visit (INDEPENDENT_AMBULATORY_CARE_PROVIDER_SITE_OTHER): Payer: Managed Care, Other (non HMO) | Admitting: Family

## 2022-01-28 ENCOUNTER — Other Ambulatory Visit: Payer: Self-pay

## 2022-01-28 ENCOUNTER — Encounter: Payer: Self-pay | Admitting: Family

## 2022-01-28 VITALS — BP 111/78 | HR 76 | Temp 98.3°F | Ht <= 58 in | Wt 176.0 lb

## 2022-01-28 DIAGNOSIS — K219 Gastro-esophageal reflux disease without esophagitis: Secondary | ICD-10-CM

## 2022-01-28 MED ORDER — FAMOTIDINE 20 MG PO TABS: 20.0000 mg | ORAL_TABLET | Freq: Two times a day (BID) | ORAL | 5 refills | Status: DC

## 2022-01-28 NOTE — Patient Instructions (Addendum)
Welcome to [facility]! It was a pleasure meeting you today. ? ?As discussed, I have sent generic Pepcid to your pharmacy, start this today to help with your reflux symptoms. ?See the attached handout on foods to avoid to help your symptoms. ? ?Please schedule a 6 month follow up visit today. ? ? ? ?PLEASE NOTE: ? ?If you had any LAB tests please let us know if you have not heard back within a few days. You may see your results on MyChart before we have a chance to review them but we will give you a call once they are reviewed by [place]. If we ordered any REFERRALS today, please let us know if you have not heard from their office within the next week.  ?Let us know through MyChart if you are needing REFILLS, or have your pharmacy send [place] the request. You can also use MyChart to communicate with me or any office staff. ? ?Please try these tips to maintain a healthy lifestyle: ? ?Eat most of your calories during the day when you are active. Eliminate processed foods including packaged sweets (pies, cakes, cookies), reduce intake of potatoes, white bread, white pasta, and white rice. Look for whole grain options, oat flour or almond flour. ? ?Each meal should contain half fruits/vegetables, one quarter protein, and one quarter carbs (no bigger than a computer mouse). ? ?Cut down on sweet beverages. This includes juice, soda, and sweet tea. Also watch fruit intake, though this is a healthier sweet option, it still contains natural sugar! Limit to 3 servings daily. ? ?Drink at least 1 glass of water with each meal and aim for at least 8 glasses per day ? ?Exercise at least 150 minutes every week.  ? ?

## 2022-01-28 NOTE — Assessment & Plan Note (Signed)
Chronic - pt is T1DM (followed by ENDO), used to take Prilosec, but stopped per label instructions. Advised on foods to avoid, handout provided reinforcing teaching. Starting generic Pepcid bid, before biggest meal & qhs - ok to reduce to qd after 2 weeks if symptoms are better, f/u in 6 mos. ?

## 2022-01-28 NOTE — Progress Notes (Signed)
? ?New Patient Office Visit ? ?Subjective:  ?Patient ID: Chelsey [name], female    DOB: 02-15-2001  Age: 21 y.o. MRN: [id number] ? ?CC:  ?Chief Complaint  ?Patient presents with  ? Establish Care  ? Gastroesophageal Reflux  ?  Increased belching since September. She states no longer taking Prilosec.  ? ? ?HPI ?Chelsey [name] presents for establishing care and to discuss one problem. ?GERD: Patient is presenting for follow up. This has been associated with belching and eructation, heartburn, and midespigastric pain.  She denies chest pain, cough, and dysphagia. Symptoms have been present for several months. She denies dysphagia.  She has not lost weight. She denies melena, hematochezia, hematemesis, and coffee ground emesis. Medical therapy in the past has included proton pump inhibitors. ? ? ?Past Medical History:  ?Diagnosis Date  ? Diabetes mellitus without complication ([place])   ? Phreesia 08/07/2020  ? Liver laceration 01/16/14  ? sledding accident  ? ? ?History reviewed. No pertinent surgical history. ? ?Family History  ?Problem Relation Age of Onset  ? Learning disabilities Mother   ? Thyroid disease Maternal Grandmother   ? Diabetes Maternal Grandfather   ? Hyperlipidemia Maternal Grandfather   ? Vision loss Maternal Grandfather   ? Learning disabilities Cousin   ?     mat cousin has ADHD  ? ? ?Social History  ? ?Socioeconomic History  ? Marital status: Single  ?  Spouse name: Not on file  ? Number of children: Not on file  ? Years of education: Not on file  ? Highest education level: Not on file  ?Occupational History  ? Not on file  ?Tobacco Use  ? Smoking status: Never  ? Smokeless tobacco: Never  ?Substance and Sexual Activity  ? Alcohol use: Yes  ?  Alcohol/week: 1.0 standard drink  ?  Types: 1 Shots of liquor per week  ? Drug use: No  ? Sexual activity: Never  ?Other Topics Concern  ? Not on file  ?Social History Narrative  ? Lives with Mother, step-Dad and 4 sibs.  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  ? ?Social Determinants of Health  ? ?Financial Resource Strain: Not on file  ?Food Insecurity: Not on file  ?Transportation Needs: Not on file  ?Physical Activity: Not on file  ?Stress: Not on file  ?Social Connections: Not on file  ?Intimate Partner Violence: Not on file  ? ? ?Objective:  ? ?Today's Vitals: BP 111/78   Pulse 76   Temp 98.3 ?F (36.8 ?C) (Temporal)   Ht _0  (1.473 m)   Wt 176 lb (79.8 kg)   LMP 01/06/2022 (Approximate)   SpO2 97%   BMI 36.78 kg/m?  ? ?Physical Exam ?Vitals and nursing note reviewed.  ?Constitutional:   ?   Appearance: Normal appearance. She is obese.  ?Cardiovascular:  ?   Rate and Rhythm: Normal rate and regular rhythm.  ?Pulmonary:  ?   Effort: Pulmonary effort is normal.  ?   Breath sounds: Normal breath sounds.  ?Musculoskeletal:     ?   General: Normal range of motion.  ?Skin: ?   General: Skin is warm and dry.  ?Neurological:  ?   Mental Status: She is alert.  ?Psychiatric:     ?   Mood and Affect: Mood normal.     ?   Behavior: Behavior normal.  ? ? ?Assessment & Plan:  ? ?Problem List Items Addressed This Visit   ? ?  ? Digestive  ?  Gastroesophageal reflux disease without esophagitis - Primary  ?  Chronic - pt is T1DM (followed by ENDO), used to take Prilosec, but stopped per label instructions. Advised on foods to avoid, handout provided reinforcing teaching. Starting generic Pepcid bid, before biggest meal & qhs - ok to reduce to qd after 2 weeks if symptoms are better, f/u in 6 mos. ?  ?  ? Relevant Medications  ? famotidine (PEPCID) 20 MG tablet  ? ? ?Outpatient Encounter Medications as of 01/28/2022  ?Medication Sig  ? Accu-Chek FastClix Lancets MISC Check sugar 8 x daily  ? Blood Glucose Monitoring Suppl (ACCU-CHEK GUIDE) w/Device KIT 1 kit by Does not apply route as needed.  ? Continuous Blood Gluc Sensor (DEXCOM G6 SENSOR) MISC Please change sensor every 10 days  ? Continuous Blood Gluc Transmit (DEXCOM G6 TRANSMITTER) MISC Change every 90 days  ?  famotidine (PEPCID) 20 MG tablet Take 1 tablet (20 mg total) by mouth in the morning and at bedtime. before biggest meal and at bedtime  ? Glucagon (BAQSIMI TWO PACK) 3 MG/DOSE POWD Place 3 mg into the nose as needed.  ? insulin glargine-yfgn (SEMGLEE, YFGN,) 100 UNIT/ML Pen Up to 50 units per day if pump fails  ? insulin lispro (HUMALOG KWIKPEN) 100 UNIT/ML KwikPen INJECT UP TO 50 UNITS SUBCUTANEOUSLY EVERY DAY if pump fails.  ? insulin lispro (HUMALOG) 100 UNIT/ML injection USE 300 UNITS IN INSULIN PUMP EVERY 48 HOURS  ? OneTouch Delica Lancets 12J MISC 1 Units by Does not apply route as directed.  ? [DISCONTINUED] glucagon 1 MG injection Follow package directions for low blood sugar. (Patient not taking: Reported on 02/05/2020)  ? [DISCONTINUED] glucose blood (ACCU-CHEK GUIDE) test strip Check glucose 6x daily (Patient not taking: Reported on 08/27/2021)  ? [DISCONTINUED] ibuprofen (ADVIL,MOTRIN) 200 MG tablet Take 200 mg by mouth every 6 (six) hours as needed for headache. (Patient not taking: Reported on 08/27/2021)  ? [DISCONTINUED] Omeprazole Magnesium (PRILOSEC PO) Take by mouth. (Patient not taking: Reported on 01/28/2022)  ? ?No facility-administered encounter medications on file as of 01/28/2022.  ? ? ?Follow-up: Return in about 6 months (around 07/31/2022) for reflux, med refill.  ? ?[name], NP ?

## 2022-02-10 ENCOUNTER — Encounter (INDEPENDENT_AMBULATORY_CARE_PROVIDER_SITE_OTHER): Payer: Self-pay

## 2022-02-11 ENCOUNTER — Other Ambulatory Visit (INDEPENDENT_AMBULATORY_CARE_PROVIDER_SITE_OTHER): Payer: Self-pay

## 2022-02-11 MED ORDER — BAQSIMI TWO PACK 3 MG/DOSE NA POWD: 3.0000 mg | NASAL | 3 refills | Status: DC | PRN

## 2022-02-19 ENCOUNTER — Telehealth (INDEPENDENT_AMBULATORY_CARE_PROVIDER_SITE_OTHER): Payer: Self-pay | Admitting: Family

## 2022-02-19 ENCOUNTER — Encounter (INDEPENDENT_AMBULATORY_CARE_PROVIDER_SITE_OTHER): Payer: Self-pay

## 2022-02-19 NOTE — Telephone Encounter (Signed)
I have sent rose a my chart to see if this is something that she has requested. Will f/u when she messages back.  ?

## 2022-02-19 NOTE — Telephone Encounter (Signed)
?  Name of who is calling: Dow Adolph  ? ?Caller's Relationship to Patient: [employer]  ? ?Best contact [phone] ? ?Provider they AYT:KZSWFUX Dalbert Garnet  ? ?Reason for call: sent a fax on 03/08 for new prescription, wanted to verify if received and or if a verbal script can be obtained.  ? ? ? ? ?PRESCRIPTION REFILL ONLY ? ?Name of prescription: omnipod 5 ? ?Pharmacy:[facility] ? ? ?

## 2022-02-23 ENCOUNTER — Other Ambulatory Visit (HOSPITAL_COMMUNITY): Payer: Self-pay

## 2022-02-23 ENCOUNTER — Other Ambulatory Visit (INDEPENDENT_AMBULATORY_CARE_PROVIDER_SITE_OTHER): Payer: Self-pay | Admitting: Family

## 2022-02-23 ENCOUNTER — Encounter (INDEPENDENT_AMBULATORY_CARE_PROVIDER_SITE_OTHER): Payer: Self-pay | Admitting: Pharmacist

## 2022-02-23 ENCOUNTER — Telehealth (INDEPENDENT_AMBULATORY_CARE_PROVIDER_SITE_OTHER): Payer: Self-pay | Admitting: Pharmacist

## 2022-02-23 NOTE — Telephone Encounter (Signed)
Please run benefits investigation for Omnipod 5 device. This is not a specialty medication and can be filled at the local pharmacy. ?  ?Omnipod 5 G6 Intro Kit (1 kit, 30 day supply), NDC [id number] ?  ?Omnipod 5 G6 Pods (3 boxes (each box has 5 pods), 30 day supply), NDC [phone] ?  ?Can you also please let me know ?1) if PA is required? ?2) RxBIN, RxPCN, RxGroup, ID number of pharmacy benefits  ?  ?Thank you for involving clinical pharmacist/diabetes educator to assist in providing this patient's care.  ?  ?Drexel Iha, PharmD, BCACP, [place], CPP ? ?

## 2022-03-15 ENCOUNTER — Encounter (INDEPENDENT_AMBULATORY_CARE_PROVIDER_SITE_OTHER): Payer: Self-pay | Admitting: Family

## 2022-03-15 ENCOUNTER — Ambulatory Visit (INDEPENDENT_AMBULATORY_CARE_PROVIDER_SITE_OTHER): Payer: Managed Care, Other (non HMO) | Admitting: Family

## 2022-03-15 VITALS — BP 108/62 | HR 80 | Wt 173.6 lb

## 2022-03-15 DIAGNOSIS — Z9641 Presence of insulin pump (external) (internal): Secondary | ICD-10-CM

## 2022-03-15 DIAGNOSIS — E1065 Type 1 diabetes mellitus with hyperglycemia: Secondary | ICD-10-CM | POA: Diagnosis not present

## 2022-03-15 LAB — POCT GLUCOSE (DEVICE FOR HOME USE): POC Glucose: 177 mg/dl — AB (ref 70–99)

## 2022-03-15 NOTE — Patient Instructions (Signed)
It was a pleasure seeing you in clinic today. Please do not hesitate to contact me if you have questions or concerns.  ° °Please sign up for MyChart. This is a communication tool that allows you to send an email directly to me. This can be used for questions, prescriptions and blood sugar reports. We will also release labs to you with instructions on MyChart. Please do not use MyChart if you need immediate or emergency assistance. Ask our wonderful front office staff if you need assistance.  ° °

## 2022-03-15 NOTE — Progress Notes (Signed)
Pediatric Endocrinology Diabetes Consultation Follow-up Visit ? ?Bibiana Betsill ?May 21, 2001 ?[id number] ? ?Chief Complaint: Follow-up type 1 diabetes ? ? ?[name], NP ? ? ?HPI: ?Chelsey [name]  is a 21 y.o. female presenting for follow-up of type 1 diabetes. she is accompanied to this visit by her her mother, older brother and older sister. . ? ?1. She presented to the ER on 10/26/2017 with 2 months of polyuria, polydipsia and weigh tloss. Her glucose was 547 on arrival to ER. She was admitted to PICU for IV insulin and fluids. Once stabilized she was transferred to the pediatric floor for diabetes education and was started on Basaglar and Novolog insulin.  ? ?2. Since her last visit to clinic on 11/2021 [name] has been generally healthy. ? ?She feels like she is doing ok with diabetes care overall. She changes her pump site every 3-4 days,rarely has failed pump sites. She is carb counting/estimating pretty accurately. Boluses during eating or before eating. Hypoglycemia occurs intermittently, none severe. She beings feeling signs of low blood sugars when she is under 80.  ? ?Concerns:  ?- More variability recently with blood sugars. She thinks they will likely improve once school is over and she is more active.  ?- Questions about Omnipod 5 insulin pump.  ? ? ?Insulin regimen: TSlim insulin pump  ?Basal Rates ?12AM 1.52  ?6am ?9am 1.55  ?1.65  ?11pm 1.60  ?   ?   ? ? ? ?Insulin to Carbohydrate Ratio ?12AM 9  ?6am 6  ?11am 6  ?9pm 6  ?   ? ? ?Insulin Sensitivity Factor ?12AM 40  ?6am 30  ?11pm 40   ?   ?   ? ?Target Blood Glucose ?12AM 120   ?   ?   ?   ?   ? ? ?Hypoglycemia: Able to feel low blood sugars.  No glucagon needed recently.  ?Insulin Pump and CGm download  ? ?Med-alert ID: Not currently wearing. ?Injection sites: arms, legs and abdomen. ?Annual labs due: 04/2022 ?Ophthalmology due: 10/2020 ? ?  ?3. ROS: Greater than 10 systems reviewed with pertinent positives listed in HPI, otherwise  neg. ?Constitutional: Sleeping well. 3 lbs weight loss.  ?Eyes: No changes in vision. No blurry vision.  ?Ears/Nose/Mouth/Throat: No difficulty swallowing. No neck pain  ?Cardiovascular: No palpitations or tachycardia.  ?Respiratory: No increased work of breathing. No SOB  ?Gastrointestinal: No constipation or diarrhea. No abdominal pain ?Genitourinary: No nocturia, no polyuria ?Musculoskeletal: No joint pain ?Neurologic: Normal sensation, no tremor ?Endocrine: No polydipsia.  No hyperpigmentation ?Psychiatric:. Denies depression and anxiety.  ? ?Past Medical History:   ?Past Medical History:  ?Diagnosis Date  ? Diabetes mellitus without complication ([place])   ? Phreesia 08/07/2020  ? Liver laceration 01/16/14  ? sledding accident  ? ? ?Medications:  ?Outpatient Encounter Medications as of 03/15/2022  ?Medication Sig  ? Accu-Chek FastClix Lancets MISC Check sugar 8 x daily  ? Blood Glucose Monitoring Suppl (ACCU-CHEK GUIDE) w/Device KIT 1 kit by Does not apply route as needed.  ? Continuous Blood Gluc Sensor (DEXCOM G6 SENSOR) MISC Please change sensor every 10 days  ? Continuous Blood Gluc Transmit (DEXCOM G6 TRANSMITTER) MISC Change every 90 days  ? famotidine (PEPCID) 20 MG tablet Take 1 tablet (20 mg total) by mouth in the morning and at bedtime. before biggest meal and at bedtime  ? Glucagon (BAQSIMI TWO PACK) 3 MG/DOSE POWD Place 3 mg into the nose as needed.  ? insulin glargine-yfgn (SEMGLEE, YFGN,) 100 UNIT/ML  Pen INJECT UP TO 50 UNITS PER DAY IF PUMP FAILS  ? insulin lispro (HUMALOG KWIKPEN) 100 UNIT/ML KwikPen INJECT UP TO 50 UNITS SUBCUTANEOUSLY EVERY DAY if pump fails.  ? insulin lispro (HUMALOG) 100 UNIT/ML injection USE 300 UNITS IN INSULIN PUMP EVERY 48 HOURS  ? OneTouch Delica Lancets 16X MISC 1 Units by Does not apply route as directed.  ? ?No facility-administered encounter medications on file as of 03/15/2022.  ? ? ?Allergies: ?No Known Allergies ? ?Surgical History: ?No past surgical history on  file. ? ?Family History:  ?Family History  ?Problem Relation Age of Onset  ? Learning disabilities Mother   ? Thyroid disease Maternal Grandmother   ? Diabetes Maternal Grandfather   ? Hyperlipidemia Maternal Grandfather   ? Vision loss Maternal Grandfather   ? Learning disabilities Cousin   ?     mat cousin has ADHD  ? ?  ?Social History: ?Lives with: Mother, older brother and older sister.  ?[employer] at [employer]  ? ?Physical Exam:  ?Vitals:  ? 03/15/22 1325  ?BP: 108/62  ?Pulse: 80  ?Weight: 173 lb 9.6 oz (78.7 kg)  ? ? ? ? ? ?BP 108/62   Pulse 80   Wt 173 lb 9.6 oz (78.7 kg)   BMI 36.28 kg/m?  ?Body mass index: body mass index is 36.28 kg/m?Marland Kitchen ?Growth percentile SmartLinks can only be used for patients less than 43 years old. ? ?Ht Readings from Last 3 Encounters:  ?01/28/22 4' 10"  (1.473 m)  ?02/05/20 4' 9.95" (1.472 m) (<1 %, Z= -2.47)*  ?01/11/20 4' 10"  (1.473 m) (<1 %, Z= -2.45)*  ? ?* Growth percentiles are based on CDC (Girls, 2-20 Years) data.  ? ?Wt Readings from Last 3 Encounters:  ?03/15/22 173 lb 9.6 oz (78.7 kg)  ?01/28/22 176 lb (79.8 kg)  ?12/17/21 178 lb (80.7 kg)  ? ? ?Physical Exam  ?General: Well developed, well nourished female in no acute distress.  ?Head: Normocephalic, atraumatic.   ?Eyes:  Pupils equal and round. EOMI.   Sclera white.  No eye drainage.   ?Ears/Nose/Mouth/Throat: Nares patent, no nasal drainage.  Normal dentition, mucous membranes moist.   ?Neck: supple, no cervical lymphadenopathy, no thyromegaly ?Cardiovascular: regular rate, normal S1/S2, no murmurs ?Respiratory: No increased work of breathing.  Lungs clear to auscultation bilaterally.  No wheezes. ?Abdomen: soft, nontender, nondistended. No appreciable masses  ?Extremities: warm, well perfused, cap refill < 2 sec.   ?Musculoskeletal: Normal muscle mass.  Normal strength ?Skin: warm, dry.  No rash or lesions. ?Neurologic: alert and oriented, normal speech, no tremor ? ? ? ?Labs: ?Last hemoglobin A1c:6.7% on 11/2021 ?Lab  Results  ?Component Value Date  ? HGBA1C 6.7 (A) 12/17/2021  ? ? ? ?Assessment/Plan: ?Araiyah is a 21 y.o. female with type 1 diabetes on insulin pump therapy. Overall [name] is doing well with her diabetes care. His TIR is 70% which meets goal along with minimal hypoglycemia. She has occasional blood sugar spikes after lunch but this may improve with increasing activity.  ? ? ?1-2. type 1 diabetes mellitus in pediatric patient (HCC)/Hyperglycemia/  ?- Reviewed insulin pump and CGM download. Discussed trends and patterns.  ?- Rotate pump sites to prevent scar tissue.  ?- bolus 15 minutes prior to eating to limit blood sugar spikes.  ?- Reviewed carb counting and importance of accurate carb counting.  ?- Discussed signs and symptoms of hypoglycemia. Always have glucose available.  ?- POCT glucose and hemoglobin A1c  ?- Reviewed growth chart.  ?-  Discussed diabetes technology including Omnipod 5 insulin pump and Dexcom G7.  ? ?3. Insulin pump titration / Pump in place.  ?- Advised that if blood sugars continue to trend high after lunch, contact me and will change lunch carb ratio in pump setings.  ? ?For injections during pump holiday  ?- 39 units of Lantus  ?- Novolog 1 unit for 7 grams of carbs  ? - 1 unit for 30 points over 120.  ? ? ?Follow-up: 3 months  ? ? ?>45 spent today reviewing the medical chart, counseling the patient/family, and documenting today's visit.  ? ?When a patient is on insulin, intensive monitoring of blood glucose levels is necessary to avoid hyperglycemia and hypoglycemia. Severe hyperglycemia/hypoglycemia can lead to hospital admissions and be life threatening.  ? ? ?Hermenia Bers,  FNP-C  ?Pediatric Specialist  ?[place]  ?[place], [id number]  ?Tele: [phone] ? ? ? ? ? ?

## 2022-03-16 ENCOUNTER — Ambulatory Visit (INDEPENDENT_AMBULATORY_CARE_PROVIDER_SITE_OTHER): Payer: Managed Care, Other (non HMO) | Admitting: Family

## 2022-03-24 ENCOUNTER — Other Ambulatory Visit (INDEPENDENT_AMBULATORY_CARE_PROVIDER_SITE_OTHER): Payer: Self-pay | Admitting: Family

## 2022-03-24 DIAGNOSIS — E109 Type 1 diabetes mellitus without complications: Secondary | ICD-10-CM

## 2022-03-26 ENCOUNTER — Encounter (INDEPENDENT_AMBULATORY_CARE_PROVIDER_SITE_OTHER): Payer: Self-pay

## 2022-06-06 ENCOUNTER — Encounter (INDEPENDENT_AMBULATORY_CARE_PROVIDER_SITE_OTHER): Payer: Self-pay

## 2022-06-07 ENCOUNTER — Other Ambulatory Visit (INDEPENDENT_AMBULATORY_CARE_PROVIDER_SITE_OTHER): Payer: Self-pay

## 2022-06-07 DIAGNOSIS — E1065 Type 1 diabetes mellitus with hyperglycemia: Secondary | ICD-10-CM

## 2022-06-07 MED ORDER — DEXCOM G6 SENSOR MISC: 5 refills | Status: DC

## 2022-06-15 ENCOUNTER — Encounter (INDEPENDENT_AMBULATORY_CARE_PROVIDER_SITE_OTHER): Payer: Self-pay | Admitting: Family

## 2022-06-15 ENCOUNTER — Ambulatory Visit (INDEPENDENT_AMBULATORY_CARE_PROVIDER_SITE_OTHER): Payer: Managed Care, Other (non HMO) | Admitting: Family

## 2022-06-15 VITALS — BP 110/68 | HR 83 | Wt 170.6 lb

## 2022-06-15 DIAGNOSIS — Z9641 Presence of insulin pump (external) (internal): Secondary | ICD-10-CM

## 2022-06-15 DIAGNOSIS — E1065 Type 1 diabetes mellitus with hyperglycemia: Secondary | ICD-10-CM

## 2022-06-15 LAB — POCT GLUCOSE (DEVICE FOR HOME USE): Glucose Fasting, POC: 75 mg/dL (ref 70–99)

## 2022-06-15 LAB — POCT GLYCOSYLATED HEMOGLOBIN (HGB A1C): Hemoglobin A1C: 6.7 % — AB (ref 4.0–5.6)

## 2022-06-15 MED ORDER — INSULIN GLARGINE-YFGN 100 UNIT/ML ~~LOC~~ SOPN: PEN_INJECTOR | SUBCUTANEOUS | 1 refills | Status: DC

## 2022-06-15 NOTE — Progress Notes (Signed)
Pediatric Endocrinology Diabetes Consultation Follow-up Visit  [name] 10-20-2001 [id number]  Chief Complaint: Follow-up type 1 diabetes   Chelsey Sewer, NP   HPI: Chelsey [name]  is a 21 y.o. female presenting for follow-up of type 1 diabetes. she is accompanied to this visit by her her mother, older brother and older sister. .  1. She presented to the ER on 10/26/2017 with 2 months of polyuria, polydipsia and weigh tloss. Her glucose was 547 on arrival to ER. She was admitted to PICU for IV insulin and fluids. Once stabilized she was transferred to the pediatric floor for diabetes education and was started on Basaglar and Novolog insulin.   2. Since her last visit to clinic on 02/2022 [name] has been generally healthy.  Recently got back from [place] in July, she has also been very busy with work.   She took about 7 days off from insulin pump when she was in July, she found that her blood sugars ran higher when she was on injections. Tslim insulin pump and Dexcom CGM are working well. She reports bolusing more consistently but usually boluses as she is eating instead of before eating. Estimates 25-40 grams of carbs per meal. Hypoglycemia is rare.    Insulin regimen: TSlim insulin pump  Basal Rates 12AM 1.52  6am 9am 1.55  1.65  11pm 1.60           Insulin to Carbohydrate Ratio 12AM 9  6am 6  11am 6  9pm 6       Insulin Sensitivity Factor 12AM 40  6am 30  11pm 40          Target Blood Glucose 12AM 120                 Hypoglycemia: Able to feel low blood sugars.  No glucagon needed recently.  Insulin Pump and CGm download   Med-alert ID: Not currently wearing. Injection sites: arms, legs and abdomen. Annual labs due: 04/2023 Ophthalmology due: 10/2020    3. ROS: Greater than 10 systems reviewed with pertinent positives listed in HPI, otherwise neg. Constitutional: Sleeping well. 3 lbs weight loss.  Eyes: No changes in vision. No blurry vision.   Ears/Nose/Mouth/Throat: No difficulty swallowing. No neck pain  Cardiovascular: No palpitations or tachycardia.  Respiratory: No increased work of breathing. No SOB  Gastrointestinal: No constipation or diarrhea. No abdominal pain Genitourinary: No nocturia, no polyuria Musculoskeletal: No joint pain Neurologic: Normal sensation, no tremor Endocrine: No polydipsia.  No hyperpigmentation Psychiatric:. Denies depression and anxiety.   Past Medical History:   Past Medical History:  Diagnosis Date   Diabetes mellitus without complication ([place])    Phreesia 08/07/2020   Liver laceration 01/16/14   sledding accident    Medications:  Outpatient Encounter Medications as of 06/15/2022  Medication Sig   Continuous Blood Gluc Sensor (DEXCOM G6 SENSOR) MISC Please change sensor every 10 days   Continuous Blood Gluc Transmit (DEXCOM G6 TRANSMITTER) MISC Change every 90 days   Glucagon (BAQSIMI TWO PACK) 3 MG/DOSE POWD Place 3 mg into the nose as needed.   insulin glargine-yfgn (SEMGLEE, YFGN,) 100 UNIT/ML Pen INJECT UP TO 50 UNITS PER DAY IF PUMP FAILS   insulin lispro (HUMALOG KWIKPEN) 100 UNIT/ML KwikPen INJECT UP TO 50 UNITS SUBCUTANEOUSLY EVERY DAY if pump fails.   insulin lispro (HUMALOG) 100 UNIT/ML injection USE 300 UNITS IN INSULIN PUMP EVERY 48 HOURS   Accu-Chek FastClix Lancets MISC Check sugar 8 x daily (Patient not taking: Reported on 06/15/2022)  Blood Glucose Monitoring Suppl (ACCU-CHEK GUIDE) w/Device KIT 1 kit by Does not apply route as needed. (Patient not taking: Reported on 06/15/2022)   famotidine (PEPCID) 20 MG tablet Take 1 tablet (20 mg total) by mouth in the morning and at bedtime. before biggest meal and at bedtime (Patient not taking: Reported on 0/01/7047)   OneTouch Delica Lancets 88B MISC 1 Units by Does not apply route as directed. (Patient not taking: Reported on 06/15/2022)   No facility-administered encounter medications on file as of 06/15/2022.    Allergies: No  Known Allergies  Surgical History: No past surgical history on file.  Family History:  Family History  Problem Relation Age of Onset   Learning disabilities Mother    Thyroid disease Maternal Grandmother    Diabetes Maternal Grandfather    Hyperlipidemia Maternal Grandfather    Vision loss Maternal Grandfather    Learning disabilities Cousin        mat cousin has ADHD     Social History: Lives with: Mother, older brother and older sister.  [employer] at [employer]   Physical Exam:  Vitals:   06/15/22 0926  BP: 110/68  Pulse: 83  Weight: 170 lb 9.6 oz (77.4 kg)        BP 110/68   Pulse 83   Wt 170 lb 9.6 oz (77.4 kg)   BMI 35.66 kg/m  Body mass index: body mass index is 35.66 kg/m. Growth %ile SmartLinks can only be used for patients less than 20 years old.  Ht Readings from Last 3 Encounters:  01/28/22 _0  (1.473 m)  02/05/20 4' 9.95" (1.472 m) (<1 %, Z= -2.47)*  01/11/20 _1  (1.473 m) (<1 %, Z= -2.45)*   * Growth percentiles are based on CDC (Girls, 2-20 Years) data.   Wt Readings from Last 3 Encounters:  06/15/22 170 lb 9.6 oz (77.4 kg)  03/15/22 173 lb 9.6 oz (78.7 kg)  01/28/22 176 lb (79.8 kg)    Physical Exam   General: Well developed, well nourished female in no acute distress.   Head: Normocephalic, atraumatic.   Eyes:  Pupils equal and round. EOMI.   Sclera white.  No eye drainage.   Ears/Nose/Mouth/Throat: Nares patent, no nasal drainage.  Normal dentition, mucous membranes moist.   Neck: supple, no cervical lymphadenopathy, no thyromegaly Cardiovascular: regular rate, normal S1/S2, no murmurs Respiratory: No increased work of breathing.  Lungs clear to auscultation bilaterally.  No wheezes. Abdomen: soft, nontender, nondistended. No appreciable masses  Extremities: warm, well perfused, cap refill < 2 sec.   Musculoskeletal: Normal muscle mass.  Normal strength Skin: warm, dry.  No rash or lesions. Neurologic: alert and oriented, normal  speech, no tremor   Labs: Last hemoglobin A1c:6.7% on 11/2021 Lab Results  Component Value Date   HGBA1C 6.7 (A) 06/15/2022     Assessment/Plan: Lakina is a 21 y.o. female with type 1 diabetes on insulin pump therapy. She is doing well on insulin pump therapy, more hyperglycemia when she was on injections during vacation. Hemoglobin A1c is stable at 6.7% which meets ADA goal of <7%.    1-2. type 1 diabetes mellitus in pediatric patient (HCC)/Hyperglycemia/  - Reviewed insulin pump and CGM download. Discussed trends and patterns.  - Rotate pump sites to prevent scar tissue.  - bolus 15 minutes prior to eating to limit blood sugar spikes.  - Reviewed carb counting and importance of accurate carb counting.  - Discussed signs and symptoms of hypoglycemia. Always have glucose available.  -  POCT glucose and hemoglobin A1c  - Reviewed growth chart.  - Refill for Semglee  - Lipid panel, TFts, and microalbumin ordered.   3. Insulin pump titration / Pump in place.  - No changes today. Pump in place.   For injections during pump holiday  - 39 units of Lantus  - Novolog 1 unit for 7 grams of carbs   - 1 unit for 30 points over 120.    Follow-up: 3 months   LOS: >40  spent today reviewing the medical chart, counseling the patient/family, and documenting today's visit.   When a patient is on insulin, intensive monitoring of blood glucose levels is necessary to avoid hyperglycemia and hypoglycemia. Severe hyperglycemia/hypoglycemia can lead to hospital admissions and be life threatening.    Hermenia Bers,  FNP-C  Pediatric Specialist  [address] [place]  [place], [address]  Tele: [phone]

## 2022-06-15 NOTE — Patient Instructions (Signed)
-   Consider labs for celiac disease.  - It was a pleasure seeing you in clinic today. Please do not hesitate to contact me if you have questions or concerns.    Please sign up for MyChart. This is a communication tool that allows you to send an email directly to me. This can be used for questions, prescriptions and blood sugar reports. We will also release labs to you with instructions on MyChart. Please do not use MyChart if you need immediate or emergency assistance. Ask our wonderful front office staff if you need assistance.

## 2022-06-16 LAB — MICROALBUMIN / CREATININE URINE RATIO
Creatinine, Urine: 243 mg/dL (ref 20–275)
Microalb Creat Ratio: 2 mcg/mg creat (ref <30)
Microalb, Ur: 0.4 mg/dL

## 2022-06-16 LAB — T4, FREE: Free T4: 1.1 ng/dL (ref 0.8–1.8)

## 2022-06-16 LAB — LIPID PANEL
Cholesterol: 185 mg/dL (ref <200)
HDL: 60 mg/dL (ref >=50)
LDL Cholesterol (Calc): 111 mg/dL (calc) — ABNORMAL HIGH
Non-HDL Cholesterol (Calc): 125 mg/dL (calc) (ref <130)
Total CHOL/HDL Ratio: 3.1 (calc) (ref <5.0)
Triglycerides: 48 mg/dL (ref <150)

## 2022-06-16 LAB — TSH: TSH: 2.02 mIU/L

## 2022-07-01 ENCOUNTER — Encounter (INDEPENDENT_AMBULATORY_CARE_PROVIDER_SITE_OTHER): Payer: Self-pay

## 2022-08-05 ENCOUNTER — Encounter: Payer: Self-pay | Admitting: Family

## 2022-08-05 ENCOUNTER — Ambulatory Visit (INDEPENDENT_AMBULATORY_CARE_PROVIDER_SITE_OTHER): Payer: Managed Care, Other (non HMO) | Admitting: Family

## 2022-08-05 VITALS — BP 102/70 | HR 83 | Ht <= 58 in | Wt 165.2 lb

## 2022-08-05 DIAGNOSIS — K219 Gastro-esophageal reflux disease without esophagitis: Secondary | ICD-10-CM

## 2022-08-05 NOTE — Patient Instructions (Signed)
It was very nice to see you today!   Continue to take the Famotidine for your acid reflux. Remember it works best if taken as a preventative of the reflux versus as needed. If you do take as needed, you should take for 3-5 days to be sure symptoms are controlled.  No follow up needed, just if any future concerns!         PLEASE NOTE:  If you had any lab tests please let us know if you have not heard back within a few days. You may see your results on MyChart before we have a chance to review them but we will give you a call once they are reviewed by [place]. If we ordered any referrals today, please let us know if you have not heard from their office within the next week.

## 2022-08-05 NOTE — Assessment & Plan Note (Signed)
   chronic  pt is better, now taking generic Pepcid prn  pt has made dietary changes  no refill needed today  f/u prn

## 2022-08-05 NOTE — Progress Notes (Signed)
Patient ID: Chelsey [name], female    DOB: [birth year], 21 y.o.   MRN: [id number]  Chief Complaint  Patient presents with   Follow-up   Gastroesophageal Reflux   Medication Refill    HPI: GERD: Patient is presenting for follow up. This has been associated with belching and eructation, heartburn, and midespigastric pain.  She denies chest pain, cough, and dysphagia. Symptoms have been present for several months. She denies dysphagia.  She has not lost weight. She denies melena, hematochezia, hematemesis, and coffee ground emesis. Medical therapy in the past has included proton pump inhibitors.  Assessment & Plan:   Problem List Items Addressed This Visit       Digestive   Gastroesophageal reflux disease without esophagitis - Primary    chronic pt is better, now taking generic Pepcid prn pt has made dietary changes no refill needed today f/u prn       Subjective:    Outpatient Medications Prior to Visit  Medication Sig Dispense Refill   Continuous Blood Gluc Sensor (DEXCOM G6 SENSOR) MISC Please change sensor every 10 days 3 each 5   Continuous Blood Gluc Transmit (DEXCOM G6 TRANSMITTER) MISC Change every 90 days 1 each 2   Glucagon (BAQSIMI TWO PACK) 3 MG/DOSE POWD Place 3 mg into the nose as needed. 1 each 3   insulin glargine-yfgn (SEMGLEE, YFGN,) 100 UNIT/ML Pen INJECT UP TO 50 UNITS PER DAY IF PUMP FAILS 15 mL 1   insulin lispro (HUMALOG KWIKPEN) 100 UNIT/ML KwikPen INJECT UP TO 50 UNITS SUBCUTANEOUSLY EVERY DAY if pump fails. 15 mL 2   insulin lispro (HUMALOG) 100 UNIT/ML injection USE 300 UNITS IN INSULIN PUMP EVERY 48 HOURS 130 mL 1   Accu-Chek FastClix Lancets MISC Check sugar 8 x daily (Patient not taking: Reported on 06/15/2022) 250 each 3   Blood Glucose Monitoring Suppl (ACCU-CHEK GUIDE) w/Device KIT 1 kit by Does not apply route as needed. (Patient not taking: Reported on 06/15/2022) 2 kit 5   famotidine (PEPCID) 20 MG tablet Take 1 tablet (20 mg total) by mouth in  the morning and at bedtime. before biggest meal and at bedtime (Patient not taking: Reported on 06/15/2022) 60 tablet 5   OneTouch Delica Lancets 67Y MISC 1 Units by Does not apply route as directed. (Patient not taking: Reported on 06/15/2022) 600 each 1   No facility-administered medications prior to visit.   Past Medical History:  Diagnosis Date   Diabetes mellitus without complication ([place])    Phreesia 08/07/2020   Liver laceration 01/16/14   sledding accident   History reviewed. No pertinent surgical history. No Known Allergies    Objective:    Physical Exam Vitals and nursing note reviewed.  Constitutional:      Appearance: Normal appearance.  Cardiovascular:     Rate and Rhythm: Normal rate and regular rhythm.  Pulmonary:     Effort: Pulmonary effort is normal.     Breath sounds: Normal breath sounds.  Musculoskeletal:        General: Normal range of motion.  Skin:    General: Skin is warm and dry.  Neurological:     Mental Status: She is alert.  Psychiatric:        Mood and Affect: Mood normal.        Behavior: Behavior normal.    BP 102/70   Pulse 83   Ht 4' 10" (1.473 m)   Wt 165 lb 3.2 oz (74.9 kg)   LMP 07/26/2022 (Exact Date)  SpO2 97%   BMI 34.53 kg/m  Wt Readings from Last 3 Encounters:  08/05/22 165 lb 3.2 oz (74.9 kg)  06/15/22 170 lb 9.6 oz (77.4 kg)  03/15/22 173 lb 9.6 oz (78.7 kg)       [name], NP

## 2022-08-19 ENCOUNTER — Ambulatory Visit: Payer: Managed Care, Other (non HMO)

## 2022-08-24 ENCOUNTER — Ambulatory Visit: Payer: Managed Care, Other (non HMO)

## 2022-08-27 ENCOUNTER — Encounter (INDEPENDENT_AMBULATORY_CARE_PROVIDER_SITE_OTHER): Payer: Self-pay

## 2022-08-27 ENCOUNTER — Ambulatory Visit: Payer: Managed Care, Other (non HMO)

## 2022-08-30 ENCOUNTER — Other Ambulatory Visit (INDEPENDENT_AMBULATORY_CARE_PROVIDER_SITE_OTHER): Payer: Self-pay

## 2022-08-30 DIAGNOSIS — E1065 Type 1 diabetes mellitus with hyperglycemia: Secondary | ICD-10-CM

## 2022-08-30 MED ORDER — DEXCOM G6 TRANSMITTER MISC: 2 refills | Status: DC

## 2022-09-14 ENCOUNTER — Ambulatory Visit (INDEPENDENT_AMBULATORY_CARE_PROVIDER_SITE_OTHER): Payer: Managed Care, Other (non HMO) | Admitting: Family

## 2022-09-14 ENCOUNTER — Encounter (INDEPENDENT_AMBULATORY_CARE_PROVIDER_SITE_OTHER): Payer: Self-pay | Admitting: Family

## 2022-09-14 VITALS — BP 110/80 | HR 74 | Wt 163.2 lb

## 2022-09-14 DIAGNOSIS — E1065 Type 1 diabetes mellitus with hyperglycemia: Secondary | ICD-10-CM | POA: Diagnosis not present

## 2022-09-14 DIAGNOSIS — Z9641 Presence of insulin pump (external) (internal): Secondary | ICD-10-CM | POA: Diagnosis not present

## 2022-09-14 DIAGNOSIS — Z23 Encounter for immunization: Secondary | ICD-10-CM

## 2022-09-14 LAB — POCT GLYCOSYLATED HEMOGLOBIN (HGB A1C): Hemoglobin A1C: 6.5 % — AB (ref 4.0–5.6)

## 2022-09-14 LAB — POCT GLUCOSE (DEVICE FOR HOME USE): Glucose Fasting, POC: 132 mg/dL — AB (ref 70–99)

## 2022-09-14 NOTE — Patient Instructions (Signed)
It was a pleasure seeing you in clinic today. Please do not hesitate to contact me if you have questions or concerns.  ° °Please sign up for MyChart. This is a communication tool that allows you to send an email directly to me. This can be used for questions, prescriptions and blood sugar reports. We will also release labs to you with instructions on MyChart. Please do not use MyChart if you need immediate or emergency assistance. Ask our wonderful front office staff if you need assistance.  ° °

## 2022-09-14 NOTE — Progress Notes (Signed)
Pediatric Endocrinology Diabetes Consultation Follow-up Visit  Chelsey [name] Nov 16, 2001 [id number]  Chief Complaint: Follow-up type 1 diabetes   [name] Sellar, NP   HPI: Chelsey [name]  is a 21 y.o. female presenting for follow-up of type 1 diabetes. she is accompanied to this visit by her her mother, older brother and older sister. .  1. She presented to the ER on 10/26/2017 with 2 months of polyuria, polydipsia and weigh tloss. Her glucose was 547 on arrival to ER. She was admitted to PICU for IV insulin and fluids. Once stabilized she was transferred to the pediatric floor for diabetes education and was started on Basaglar and Novolog insulin.   2. Since her last visit to clinic on 05/2022 Chelsey [name] has been generally healthy.  She has been busy with work, school and homework.   Using Tandem insulin pump and Dexcom CGM, both have been working well for her. She reports occasionally having blood sugar swings but overall thinks blood sugars have been stable. If she does go low it tends to be when she is at work and has not had time to eat. She reports a few period of hyperglycemia which is usually when her site goes bad after 4-5 days of use.     Insulin regimen: TSlim insulin pump  Basal Rates 12AM 1.52  6am 9am 1.55  1.65  11pm 1.60           Insulin to Carbohydrate Ratio 12AM 9  6am 6  11am 6  9pm 6       Insulin Sensitivity Factor 12AM 40  6am 30  11pm 40          Target Blood Glucose 12AM 120                 Hypoglycemia: Able to feel low blood sugars.  No glucagon needed recently.  Insulin Pump and CGm download   Med-alert ID: Not currently wearing. Injection sites: arms, legs and abdomen. Annual labs due: 04/2023 Ophthalmology due: 10/2020    3. ROS: Greater than 10 systems reviewed with pertinent positives listed in HPI, otherwise neg. Constitutional: Sleeping well.  Eyes: No changes in vision. No blurry vision.  Ears/Nose/Mouth/Throat: No  difficulty swallowing. No neck pain  Cardiovascular: No palpitations or tachycardia.  Respiratory: No increased work of breathing. No SOB  Gastrointestinal: No constipation or diarrhea. No abdominal pain Genitourinary: No nocturia, no polyuria Musculoskeletal: No joint pain Neurologic: Normal sensation, no tremor Endocrine: No polydipsia.  No hyperpigmentation Psychiatric:. Denies depression and anxiety.   Past Medical History:   Past Medical History:  Diagnosis Date   Diabetes mellitus without complication (HCC)    Phreesia 08/07/2020   Liver laceration 01/16/14   sledding accident    Medications:  Outpatient Encounter Medications as of 09/14/2022  Medication Sig   Continuous Blood Gluc Sensor (DEXCOM G6 SENSOR) MISC Please change sensor every 10 days   Continuous Blood Gluc Transmit (DEXCOM G6 TRANSMITTER) MISC Change every 90 days   famotidine (PEPCID) 20 MG tablet Take 20 mg by mouth daily.   Glucagon (BAQSIMI TWO PACK) 3 MG/DOSE POWD Place 3 mg into the nose as needed.   insulin glargine-yfgn (SEMGLEE, YFGN,) 100 UNIT/ML Pen INJECT UP TO 50 UNITS PER DAY IF PUMP FAILS   insulin lispro (HUMALOG KWIKPEN) 100 UNIT/ML KwikPen INJECT UP TO 50 UNITS SUBCUTANEOUSLY EVERY DAY if pump fails.   insulin lispro (HUMALOG) 100 UNIT/ML injection USE 300 UNITS IN INSULIN PUMP EVERY 48 HOURS   No facility-administered encounter  medications on file as of 09/14/2022.    Allergies: No Known Allergies  Surgical History: History reviewed. No pertinent surgical history.  Family History:  Family History  Problem Relation Age of Onset   Learning disabilities Mother    Thyroid disease Maternal Grandmother    Diabetes Maternal Grandfather    Hyperlipidemia Maternal Grandfather    Vision loss Maternal Grandfather    Learning disabilities Cousin        mat cousin has ADHD     Social History: Lives with: Mother, older brother and older sister.  [employer] at [employer]   Physical Exam:  Vitals:    09/14/22 1104  BP: 110/80  Pulse: 74  Weight: 163 lb 3.2 oz (74 kg)         BP 110/80   Pulse 74   Wt 163 lb 3.2 oz (74 kg)   BMI 34.11 kg/m  Body mass index: body mass index is 34.11 kg/m. Growth %ile SmartLinks can only be used for patients less than 28 years old.  Ht Readings from Last 3 Encounters:  08/05/22 4\' 10"  (1.473 m)  01/28/22 4\' 10"  (1.473 m)  02/05/20 4' 9.95" (1.472 m) (<1 %, Z= -2.47)*   * Growth percentiles are based on CDC (Girls, 2-20 Years) data.   Wt Readings from Last 3 Encounters:  09/14/22 163 lb 3.2 oz (74 kg)  08/05/22 165 lb 3.2 oz (74.9 kg)  06/15/22 170 lb 9.6 oz (77.4 kg)    Physical Exam  General: Well developed, well nourished female in no acute distress.   Head: Normocephalic, atraumatic.   Eyes:  Pupils equal and round. EOMI.   Sclera white.  No eye drainage.   Ears/Nose/Mouth/Throat: Nares patent, no nasal drainage.  Normal dentition, mucous membranes moist.   Neck: supple, no cervical lymphadenopathy, no thyromegaly Cardiovascular: regular rate, normal S1/S2, no murmurs Respiratory: No increased work of breathing.  Lungs clear to auscultation bilaterally.  No wheezes. Abdomen: soft, nontender, nondistended. No appreciable masses  Extremities: warm, well perfused, cap refill < 2 sec.   Musculoskeletal: Normal muscle mass.  Normal strength Skin: warm, dry.  No rash or lesions. Neurologic: alert and oriented, normal speech, no tremor   Labs: Last hemoglobin A1c: 6.7% on 05/2022  Lab Results  Component Value Date   HGBA1C 6.5 (A) 09/14/2022     Assessment/Plan: Chelsey [name] is a 21 y.o. female with type 1 diabetes on insulin pump therapy in good control. Hemoglobin A1c is 6.5% which meets ADA goal of <7%. Her time in target range is 66% with minimal hypoglycemia.    1-2. type 1 diabetes mellitus in pediatric patient (HCC)/Hyperglycemia/  - Reviewed insulin pump and CGM download. Discussed trends and patterns.  - Rotate pump  sites to prevent scar tissue.  - bolus 15 minutes prior to eating to limit blood sugar spikes.  - Reviewed carb counting and importance of accurate carb counting.  - Discussed signs and symptoms of hypoglycemia. Always have glucose available.  - POCT glucose and hemoglobin A1c  - Reviewed growth chart.  - Discussed new diabetes technology including tandem mobi.   3. Insulin pump titration / Pump in place.  - No changes. .   For injections during pump holiday  - 39 units of Lantus  - Novolog 1 unit for 7 grams of carbs   - 1 unit for 30 points over 120.    Influenza vaccine given. Counseling provided.   Follow-up: 3 months   LOS: >45  spent today reviewing  the medical chart, counseling the patient/family, and documenting today's visit.    When a patient is on insulin, intensive monitoring of blood glucose levels is necessary to avoid hyperglycemia and hypoglycemia. Severe hyperglycemia/hypoglycemia can lead to hospital admissions and be life threatening.    Hermenia Bers,  FNP-C  Pediatric Specialist  [address] [place]  [place], [address]  Tele: [phone]

## 2022-09-26 ENCOUNTER — Other Ambulatory Visit (INDEPENDENT_AMBULATORY_CARE_PROVIDER_SITE_OTHER): Payer: Self-pay | Admitting: Family

## 2022-09-26 DIAGNOSIS — E109 Type 1 diabetes mellitus without complications: Secondary | ICD-10-CM

## 2022-10-03 ENCOUNTER — Encounter (INDEPENDENT_AMBULATORY_CARE_PROVIDER_SITE_OTHER): Payer: Self-pay

## 2022-10-04 ENCOUNTER — Other Ambulatory Visit (INDEPENDENT_AMBULATORY_CARE_PROVIDER_SITE_OTHER): Payer: Self-pay

## 2022-10-26 ENCOUNTER — Telehealth (INDEPENDENT_AMBULATORY_CARE_PROVIDER_SITE_OTHER): Payer: Self-pay | Admitting: Family

## 2022-10-26 NOTE — Telephone Encounter (Signed)
  Name of who is calling: Chelsey [name]  Best contact number: [phone]  Provider they see: Ovidio Kin  Reason for call: 1- patient took a break from her insulin pump and she's using pens. She's been waking up with lows for 5-6 days and wants to possibly see about changing doses. 2- she set up an appt to see an eye dr and the paper work is asking who the referring provider is and she is asking does she put you down for that?

## 2022-10-26 NOTE — Telephone Encounter (Signed)
Do you know what doses of Stirewalt acting insulin she is taking? I would recommend decreasing by 2 units to start, if she continues to go low can reduce by another two units after 3 days. .  It is fine to put me as referring for eye doctor.   Relayed Spenser's message to patient, She is giving 37 units for the Semglee at night.  She will decrease to 35 units.

## 2022-11-10 ENCOUNTER — Encounter (INDEPENDENT_AMBULATORY_CARE_PROVIDER_SITE_OTHER): Payer: Self-pay

## 2022-11-24 LAB — HM DIABETES EYE EXAM

## 2022-12-13 ENCOUNTER — Ambulatory Visit (INDEPENDENT_AMBULATORY_CARE_PROVIDER_SITE_OTHER): Payer: Managed Care, Other (non HMO) | Admitting: Family

## 2022-12-13 ENCOUNTER — Encounter (INDEPENDENT_AMBULATORY_CARE_PROVIDER_SITE_OTHER): Payer: Self-pay

## 2022-12-13 NOTE — Progress Notes (Deleted)
Pediatric Endocrinology Diabetes Consultation Follow-up Visit  Chelsey [name] Jun 30, 2001 [id number]  Chief Complaint: Follow-up type 1 diabetes   [name], NP   HPI: Chelsey [name]  is a 22 y.o. female presenting for follow-up of type 1 diabetes. she is accompanied to this visit by her her mother, older brother and older sister. .  1. She presented to the ER on 10/26/2017 with 2 months of polyuria, polydipsia and weigh tloss. Her glucose was 547 on arrival to ER. She was admitted to PICU for IV insulin and fluids. Once stabilized she was transferred to the pediatric floor for diabetes education and was started on Basaglar and Novolog insulin.   2. Since her last visit to clinic on 09/2022 [name] has been generally healthy.  She has been busy with work, school and homework.   Using Tandem insulin pump and Dexcom CGM, both have been working well for her. She reports occasionally having blood sugar swings but overall thinks blood sugars have been stable. If she does go low it tends to be when she is at work and has not had time to eat. She reports a few period of hyperglycemia which is usually when her site goes bad after 4-5 days of use.     Insulin regimen: TSlim insulin pump  Basal Rates 12AM 1.52  6am 9am 1.55  1.65  11pm 1.60           Insulin to Carbohydrate Ratio 12AM 9  6am 6  11am 6  9pm 6       Insulin Sensitivity Factor 12AM 40  6am 30  11pm 40          Target Blood Glucose 12AM 120                 Hypoglycemia: Able to feel low blood sugars.  No glucagon needed recently.  Insulin Pump and CGm download   Med-alert ID: Not currently wearing. Injection sites: arms, legs and abdomen. Annual labs due: 04/2023 Ophthalmology due: 10/2020    3. ROS: Greater than 10 systems reviewed with pertinent positives listed in HPI, otherwise neg. Constitutional: Sleeping well.  Eyes: No changes in vision. No blurry vision.  Ears/Nose/Mouth/Throat: No  difficulty swallowing. No neck pain  Cardiovascular: No palpitations or tachycardia.  Respiratory: No increased work of breathing. No SOB  Gastrointestinal: No constipation or diarrhea. No abdominal pain Genitourinary: No nocturia, no polyuria Musculoskeletal: No joint pain Neurologic: Normal sensation, no tremor Endocrine: No polydipsia.  No hyperpigmentation Psychiatric:. Denies depression and anxiety.   Past Medical History:   Past Medical History:  Diagnosis Date   Diabetes mellitus without complication ([place])    Phreesia 08/07/2020   Liver laceration 01/16/14   sledding accident    Medications:  Outpatient Encounter Medications as of 12/13/2022  Medication Sig   Continuous Blood Gluc Sensor (DEXCOM G6 SENSOR) MISC Please change sensor every 10 days   Continuous Blood Gluc Transmit (DEXCOM G6 TRANSMITTER) MISC Change every 90 days   famotidine (PEPCID) 20 MG tablet Take 20 mg by mouth daily.   Glucagon (BAQSIMI TWO PACK) 3 MG/DOSE POWD Place 3 mg into the nose as needed.   insulin glargine-yfgn (SEMGLEE, YFGN,) 100 UNIT/ML Pen INJECT UP TO 50 UNITS PER DAY IF PUMP FAILS   insulin lispro (HUMALOG KWIKPEN) 100 UNIT/ML KwikPen INJECT UP TO 50 UNITS SUBCUTANEOUSLY EVERY DAY if pump fails.   insulin lispro (HUMALOG) 100 UNIT/ML injection USE 300 UNITS IN INSULIN PUMP EVERY 48 HOURS   No facility-administered encounter  medications on file as of 12/13/2022.    Allergies: No Known Allergies  Surgical History: No past surgical history on file.  Family History:  Family History  Problem Relation Age of Onset   Learning disabilities Mother    Thyroid disease Maternal Grandmother    Diabetes Maternal Grandfather    Hyperlipidemia Maternal Grandfather    Vision loss Maternal Grandfather    Learning disabilities Cousin        mat cousin has ADHD     Social History: Lives with: Mother, older brother and older sister.  [employer] at [employer]   Physical Exam:  There were no vitals  filed for this visit.        There were no vitals taken for this visit. Body mass index: body mass index is unknown because there is no height or weight on file. Growth %ile SmartLinks can only be used for patients less than 62 years old.  Ht Readings from Last 3 Encounters:  08/05/22 4' 10"$  (1.473 m)  01/28/22 4' 10"$  (1.473 m)  02/05/20 4' 9.95" (1.472 m) (<1 %, Z= -2.47)*   * Growth percentiles are based on CDC (Girls, 2-20 Years) data.   Wt Readings from Last 3 Encounters:  09/14/22 163 lb 3.2 oz (74 kg)  08/05/22 165 lb 3.2 oz (74.9 kg)  06/15/22 170 lb 9.6 oz (77.4 kg)    Physical Exam  General: Well developed, well nourished female in no acute distress.   Head: Normocephalic, atraumatic.   Eyes:  Pupils equal and round. EOMI.   Sclera white.  No eye drainage.   Ears/Nose/Mouth/Throat: Nares patent, no nasal drainage.  Normal dentition, mucous membranes moist.   Neck: supple, no cervical lymphadenopathy, no thyromegaly Cardiovascular: regular rate, normal S1/S2, no murmurs Respiratory: No increased work of breathing.  Lungs clear to auscultation bilaterally.  No wheezes. Abdomen: soft, nontender, nondistended. No appreciable masses  Extremities: warm, well perfused, cap refill < 2 sec.   Musculoskeletal: Normal muscle mass.  Normal strength Skin: warm, dry.  No rash or lesions. Neurologic: alert and oriented, normal speech, no tremor   Labs: Last hemoglobin A1c: 6.5% on 08/2022  Lab Results  Component Value Date   HGBA1C 6.5 (A) 09/14/2022     Assessment/Plan: Cyndi is a 22 y.o. female with type 1 diabetes on insulin pump therapy in good control. Hemoglobin A1c is 6.5% which meets ADA goal of <7%. Her time in target range is 66% with minimal hypoglycemia.    1-2. type 1 diabetes mellitus in pediatric patient (HCC)/Hyperglycemia/  - Reviewed insulin pump and CGM download. Discussed trends and patterns.  - Rotate pump sites to prevent scar tissue.  - bolus  15 minutes prior to eating to limit blood sugar spikes.  - Reviewed carb counting and importance of accurate carb counting.  - Discussed signs and symptoms of hypoglycemia. Always have glucose available.  - POCT glucose and hemoglobin A1c  - Reviewed growth chart.    3. Insulin pump titration / Pump in place.  - No changes. .   For injections during pump holiday  - 35units of Semglee - Novolog 1 unit for 7 grams of carbs   - 1 unit for 30 points over 120.    Influenza vaccine given. Counseling provided.   Follow-up: 3 months   LOS: >45  spent today reviewing the medical chart, counseling the patient/family, and documenting today's visit.    When a patient is on insulin, intensive monitoring of blood glucose levels is necessary  to avoid hyperglycemia and hypoglycemia. Severe hyperglycemia/hypoglycemia can lead to hospital admissions and be life threatening.    Hermenia Bers,  FNP-C  Pediatric Specialist  [address] [place]  [place], [address]  Tele: [phone]

## 2023-01-07 ENCOUNTER — Telehealth (INDEPENDENT_AMBULATORY_CARE_PROVIDER_SITE_OTHER): Payer: Self-pay | Admitting: Family

## 2023-01-07 NOTE — Telephone Encounter (Signed)
  Name of who is calling: Mertie  Caller's Relationship to Patient: Self  Best contact number: [phone]  Provider they see: Hermenia Bers  Reason for call: [place] is calling because has questions about her insulin pump.      PRESCRIPTION REFILL ONLY  Name of prescription:  Pharmacy:

## 2023-01-07 NOTE — Telephone Encounter (Signed)
Returned call. LVM with call back number.

## 2023-01-11 MED ORDER — "INSULIN SYRINGE-NEEDLE U-100 31G X 5/16"" 0.5 ML MISC": 5 refills | Status: AC

## 2023-01-11 MED ORDER — INSULIN GLARGINE-YFGN 100 UNIT/ML ~~LOC~~ SOPN: PEN_INJECTOR | SUBCUTANEOUS | 5 refills | Status: AC

## 2023-01-11 MED ORDER — ACCU-CHEK GUIDE W/DEVICE KIT: PACK | 1 refills | Status: AC

## 2023-01-11 NOTE — Telephone Encounter (Signed)
Spoke with patient, she has been having issues with her pump sites and from not rotating sites well.  I asked her about using a true steel site, she is not interested right now and would like to do shots.  She stated that she has lots of vials and would like to use those.  Could she use the needles/syringes that come with her pump supplies.  I told her no, she will need insulin syringes and we can order those.  I asked if she knew how to use them, she stated no.  I tried to verbally explain.  She stated that she has enough to get her to her next appt with Spenser.  II told her if she realizes she does not to let us know and we can get her scheduled virtually with Dr. Lovena Le or she can stop in the office for [place] to show her.  She needed refills for her semglee as well.  I asked if she has her shot plan, she stated yes.  She also stated that she lost her accu chek meter last night.  Sent in script for new one, she uses the lancing device with the single use lancets.  I made note to the pharmacy, also told her to ask the pharmacy if her insurance covers another meter and we can send in those supplies. Made note to pharmacy they can fill for glucometer insurance covers.  Spoke with Spenser and Dr. Charna Archer to find appropriate insulin syringes to order and updated Spenser on her current situation.

## 2023-01-11 NOTE — Telephone Encounter (Signed)
Spoke with rose. She stated that on Friday she called regarding her pump. Pump infusion set malfunctioned. She replaced it. She said that it worked for 2 days and stopped. She's rotated sites. She asked about just giving her self insulin shots but from the vials. I let her speak with Evette Georges RN regarding that.

## 2023-01-11 NOTE — Addendum Note (Signed)
Addended by: Mike Gip A on: 01/11/2023 09:10 AM   Modules accepted: Orders

## 2023-01-31 ENCOUNTER — Ambulatory Visit (INDEPENDENT_AMBULATORY_CARE_PROVIDER_SITE_OTHER): Payer: Managed Care, Other (non HMO) | Admitting: Family

## 2023-01-31 ENCOUNTER — Encounter (INDEPENDENT_AMBULATORY_CARE_PROVIDER_SITE_OTHER): Payer: Self-pay | Admitting: Family

## 2023-01-31 ENCOUNTER — Encounter: Payer: Self-pay | Admitting: Family

## 2023-01-31 VITALS — BP 118/74 | HR 68 | Wt 161.0 lb

## 2023-01-31 VITALS — BP 106/71 | HR 98 | Temp 97.1°F | Ht <= 58 in | Wt 160.6 lb

## 2023-01-31 DIAGNOSIS — K219 Gastro-esophageal reflux disease without esophagitis: Secondary | ICD-10-CM | POA: Diagnosis not present

## 2023-01-31 DIAGNOSIS — E1065 Type 1 diabetes mellitus with hyperglycemia: Secondary | ICD-10-CM

## 2023-01-31 DIAGNOSIS — Z794 Long term (current) use of insulin: Secondary | ICD-10-CM

## 2023-01-31 LAB — POCT GLYCOSYLATED HEMOGLOBIN (HGB A1C): Hemoglobin A1C: 8 % — AB (ref 4.0–5.6)

## 2023-01-31 LAB — POCT GLUCOSE (DEVICE FOR HOME USE): POC Glucose: 100 mg/dl — AB (ref 70–99)

## 2023-01-31 MED ORDER — DEXCOM G7 SENSOR MISC: 5 refills | Status: DC

## 2023-01-31 MED ORDER — FAMOTIDINE 20 MG PO TABS: 20.0000 mg | ORAL_TABLET | Freq: Every day | ORAL | 5 refills | Status: DC

## 2023-01-31 NOTE — Progress Notes (Signed)
Pediatric Endocrinology Diabetes Consultation Follow-up Visit  Chelsey [name] [birth year] [id number]  Chief Complaint: Follow-up type 1 diabetes   Chelsey Sewer, NP   HPI: Chelsey [name]  is a 22 y.o. female presenting for follow-up of type 1 diabetes. she is accompanied to this visit by her her mother, older brother and older sister. .  1. She presented to the ER on 10/26/2017 with 2 months of polyuria, polydipsia and weigh tloss. Her glucose was 547 on arrival to ER. She was admitted to PICU for IV insulin and fluids. Once stabilized she was transferred to the pediatric floor for diabetes education and was started on Basaglar and Novolog insulin.   2. Since her last visit to clinic on 08/2022 Chelsey [name] has been generally healthy.  She has switched back and forth from Tandem insulin pump and injections. She had a site failure in January and then decided to go back on her injections. Reports blood sugars running high after meals. She is having hypoglycemia early in the morning. She has not been wearing Dexcom sensors, reports she lost the transmitter. Doing fingerstick blood sugars 3-5 x per day.    Insulin regimen:  - 37 units of Semglee  - Novolog 120/30/6 plan   Basal Rates 12AM 1.52  6am 9am 1.55  1.65  11pm 1.60           Insulin to Carbohydrate Ratio 12AM 9  6am 6  11am 6  9pm 6       Insulin Sensitivity Factor 12AM 40  6am 30  11pm 40          Target Blood Glucose 12AM 120                 Hypoglycemia: Able to feel low blood sugars.  No glucagon needed recently.  Insulin Pump and CGm download   - Pattern of hypoglycemia between 2am-5am.  - pattern of post prandial hyperglycemia.   Med-alert ID: Not currently wearing. Injection sites: arms, legs and abdomen. Annual labs due: 04/2023 Ophthalmology due: 10/2020    3. ROS: Greater than 10 systems reviewed with pertinent positives listed in HPI, otherwise neg. Constitutional: Sleeping well.  Eyes: No  changes in vision. No blurry vision.  Ears/Nose/Mouth/Throat: No difficulty swallowing. No neck pain  Cardiovascular: No palpitations or tachycardia.  Respiratory: No increased work of breathing. No SOB  Gastrointestinal: No constipation or diarrhea. No abdominal pain Genitourinary: No nocturia, no polyuria Musculoskeletal: No joint pain Neurologic: Normal sensation, no tremor Endocrine: No polydipsia.  No hyperpigmentation Psychiatric:. Denies depression and anxiety.   Past Medical History:   Past Medical History:  Diagnosis Date   Diabetes mellitus without complication ([place])    Phreesia 08/07/2020   Liver laceration 01/16/14   sledding accident    Medications:  Outpatient Encounter Medications as of 01/31/2023  Medication Sig   Blood Glucose Monitoring Suppl (ACCU-CHEK GUIDE) w/Device KIT Use to check blood sugar 6 times per day   Continuous Blood Gluc Sensor (DEXCOM G7 SENSOR) MISC Change sensor every 10 days   insulin lispro (HUMALOG) 100 UNIT/ML injection USE 300 UNITS IN INSULIN PUMP EVERY 48 HOURS   [DISCONTINUED] Continuous Blood Gluc Sensor (DEXCOM G6 SENSOR) MISC Please change sensor every 10 days   [DISCONTINUED] Continuous Blood Gluc Transmit (DEXCOM G6 TRANSMITTER) MISC Change every 90 days   famotidine (PEPCID) 20 MG tablet Take 1 tablet (20 mg total) by mouth daily. (Patient not taking: Reported on 01/31/2023)   Glucagon (BAQSIMI TWO PACK) 3 MG/DOSE POWD Place 3  mg into the nose as needed. (Patient not taking: Reported on 01/31/2023)   insulin glargine-yfgn (SEMGLEE, YFGN,) 100 UNIT/ML Pen INJECT UP TO 50 UNITS PER DAY IF PUMP FAILS (Patient not taking: Reported on 01/31/2023)   insulin lispro (HUMALOG KWIKPEN) 100 UNIT/ML KwikPen INJECT UP TO 50 UNITS SUBCUTANEOUSLY EVERY DAY if pump fails. (Patient not taking: Reported on 01/31/2023)   Insulin Syringe-Needle U-100 (BD INSULIN SYRINGE ULTRAFINE) 31G X 5/16" 0.5 ML MISC Use to inject insulin 5 times per day (Patient not taking:  Reported on 01/31/2023)   [DISCONTINUED] famotidine (PEPCID) 20 MG tablet Take 20 mg by mouth daily.   No facility-administered encounter medications on file as of 01/31/2023.    Allergies: No Known Allergies  Surgical History: No past surgical history on file.  Family History:  Family History  Problem Relation Age of Onset   Learning disabilities Mother    Thyroid disease Maternal Grandmother    Diabetes Maternal Grandfather    Hyperlipidemia Maternal Grandfather    Vision loss Maternal Grandfather    Learning disabilities Cousin        mat cousin has ADHD     Social History: Lives with: Mother, older brother and older sister.  [employer] at [employer] [facility] [employer]   Physical Exam:  Vitals:   01/31/23 1123  BP: 118/74  Pulse: 68  Weight: 161 lb (73 kg)          BP 118/74   Pulse 68   Wt 161 lb (73 kg)   LMP 01/05/2023 (Exact Date)   BMI 33.65 kg/m  Body mass index: body mass index is 33.65 kg/m. Growth %ile SmartLinks can only be used for patients less than 49 years old.  Ht Readings from Last 3 Encounters:  01/31/23 '4\' 10"'$  (1.473 m)  08/05/22 '4\' 10"'$  (1.473 m)  01/28/22 '4\' 10"'$  (1.473 m)   Wt Readings from Last 3 Encounters:  01/31/23 161 lb (73 kg)  01/31/23 160 lb 9.6 oz (72.8 kg)  09/14/22 163 lb 3.2 oz (74 kg)    Physical Exam  General: Well developed, well nourished female in no acute distress.   Head: Normocephalic, atraumatic.   Eyes:  Pupils equal and round. EOMI.   Sclera white.  No eye drainage.   Ears/Nose/Mouth/Throat: Nares patent, no nasal drainage.  Normal dentition, mucous membranes moist.   Neck: supple, no cervical lymphadenopathy, no thyromegaly Cardiovascular: regular rate, normal S1/S2, no murmurs Respiratory: No increased work of breathing.  Lungs clear to auscultation bilaterally.  No wheezes. Abdomen: soft, nontender, nondistended. No appreciable masses  Extremities: warm, well perfused, cap refill < 2 sec.   Musculoskeletal: Normal muscle mass.   Normal strength Skin: warm, dry.  No rash or lesions. Neurologic: alert and oriented, normal speech, no tremor    Labs: Last hemoglobin A1c: 6.7% on 05/2022  Lab Results  Component Value Date   HGBA1C 8.0 (A) 01/31/2023     Assessment/Plan: Chelsey [name] is a 22 y.o. female with type 1 diabetes recently switched to MDI from insulin pump therapy. She is having more variability with blood glucose levels on MDI. Pattern of hypoglycemia between 2am-5am. Pattern of post prandial hyperglycemia. Hemoglobin A1c has increased to 8% which is higher then ADA goal of <7%. Her TIR has decreased to 35%.     1-2. type 1 diabetes mellitus in pediatric patient (HCC)/Hyperglycemia/  - Reviewed CGM download. Discussed trends and patterns.  - Rotate injection  sites to prevent scar tissue.  - bolus 15 minutes prior to eating to  limit blood sugar spikes.  - Reviewed carb counting and importance of accurate carb counting.  - Discussed signs and symptoms of hypoglycemia. Always have glucose available.  - POCT glucose and hemoglobin A1c  - Reviewed growth chart.  - Discussed sites for Tandem including Steel T sign which may help reduce site failures.  - Orders placed for Dexcom G7 CGM. Demonstration of insertion done during visit.   3. Insulin Dose change   - Decrease Semglee to 35 units  - Start Novolog 1:5 grams of carbs for lunch and dinner.    Follow-up: 3 months   LOS: >45  spent today reviewing the medical chart, counseling the patient/family, and documenting today's visit.     When a patient is on insulin, intensive monitoring of blood glucose levels is necessary to avoid hyperglycemia and hypoglycemia. Severe hyperglycemia/hypoglycemia can lead to hospital admissions and be life threatening.    Hermenia Bers,  FNP-C  Pediatric Specialist  [address] [place]  [place], [address]  Tele: [phone]

## 2023-01-31 NOTE — Patient Instructions (Signed)
-   Decrease Semglee to 35 units  - Start Novolog 1 unit for every 5 grams of carbs at meals.   - Dexcom G7 sent to pharmacy.   Hi!  The t:slim X2 with control IQ technology insulin pump is now compatible with the Dexcom G7 continuous glucose monitor (CGM). There are a few important key points to understand:  Software update (for existing t:slim users) -To use the t:slim with the Dexcom G7 this requires t:slim software 7.7 (not 7.6)  -To check what software your pump is currently using unlock your pump then press options then press Mypump then press Pump Info then scroll down to see what it states under t:slim software   -Refer to https://www.[email or web address]/support/software-updates/control-iq-technology or call [phone] for assistance with upgrade  Dexcom G7 -The t:slimX2 compatible Dexcom G7 CGM will have a white line under the serial number printed on the box (refer to image below)   -The pharmacy cannot control if they can order the Dexcom G7 with or without the line. They have the same national drug code [facility]) numbers, thus making it impossible to specifically request the Dexcom G7 with the line.  -If you received an incompatible, Dexcom G7 sensor to communicate with your t:slimX2 insulin pump with 7.7 software installed, please visit [email or web address] to request a compatible sensor or contact Dexcom customer support at [phone].  -Eventually, all Dexcom G7 with the line will be the [place] product available once Dexcom has sold all Dexcom G7 without the line.    Thank you!

## 2023-01-31 NOTE — Assessment & Plan Note (Addendum)
chronic pt is better, now taking generic Pepcid prn continue to advised on low acid diet including limited caffeine, keeping CBGs wnl no refill needed today f/u prn

## 2023-01-31 NOTE — Progress Notes (Signed)
Patient ID: Chelsey [name], female    DOB: 05/30/2001, 22 y.o.   MRN: [id number]  Chief Complaint  Patient presents with   Gastroesophageal Reflux    6 month follow up   Diabetes    HPI: GERD: Patient is presenting for follow up. This has been associated with belching and eructation, heartburn, and midespigastric pain.  She denies chest pain, cough, and dysphagia. Symptoms have been present for several months. She denies dysphagia.  She has not lost weight. She denies melena, hematochezia, hematemesis, and coffee ground emesis. Medical therapy in the past has included proton pump inhibitors.  Assessment & Plan:  Gastroesophageal reflux disease without esophagitis Assessment & Plan: chronic pt is better, now taking generic Pepcid prn continue to advised on low acid diet including limited caffeine, keeping CBGs wnl no refill needed today f/u prn  Orders: -     Famotidine; Take 1 tablet (20 mg total) by mouth daily.  Dispense: 30 tablet; Refill: 5    Subjective:    Outpatient Medications Prior to Visit  Medication Sig Dispense Refill   Blood Glucose Monitoring Suppl (ACCU-CHEK GUIDE) w/Device KIT Use to check blood sugar 6 times per day 1 kit 1   Continuous Blood Gluc Sensor (DEXCOM G6 SENSOR) MISC Please change sensor every 10 days 3 each 5   Continuous Blood Gluc Transmit (DEXCOM G6 TRANSMITTER) MISC Change every 90 days 1 each 2   Glucagon (BAQSIMI TWO PACK) 3 MG/DOSE POWD Place 3 mg into the nose as needed. 1 each 3   insulin glargine-yfgn (SEMGLEE, YFGN,) 100 UNIT/ML Pen INJECT UP TO 50 UNITS PER DAY IF PUMP FAILS 15 mL 5   insulin lispro (HUMALOG KWIKPEN) 100 UNIT/ML KwikPen INJECT UP TO 50 UNITS SUBCUTANEOUSLY EVERY DAY if pump fails. 15 mL 2   insulin lispro (HUMALOG) 100 UNIT/ML injection USE 300 UNITS IN INSULIN PUMP EVERY 48 HOURS 130 mL 1   Insulin Syringe-Needle U-100 (BD INSULIN SYRINGE ULTRAFINE) 31G X 5/16" 0.5 ML MISC Use to inject insulin 5 times per day 200 each 5    famotidine (PEPCID) 20 MG tablet Take 20 mg by mouth daily.     No facility-administered medications prior to visit.   Past Medical History:  Diagnosis Date   Diabetes mellitus without complication ([place])    Phreesia 08/07/2020   Liver laceration 01/16/14   sledding accident   No past surgical history on file. No Known Allergies    Objective:    Physical Exam Vitals and nursing note reviewed.  Constitutional:      Appearance: Normal appearance.  Cardiovascular:     Rate and Rhythm: Normal rate and regular rhythm.  Pulmonary:     Effort: Pulmonary effort is normal.     Breath sounds: Normal breath sounds.  Musculoskeletal:        General: Normal range of motion.  Skin:    General: Skin is warm and dry.  Neurological:     Mental Status: She is alert.  Psychiatric:        Mood and Affect: Mood normal.        Behavior: Behavior normal.    BP 106/71 (BP Location: Left Arm, Patient Position: Sitting, Cuff Size: Large)   Pulse 98   Temp (!) 97.1 F (36.2 C) (Temporal)   Ht '4\' 10"'$  (1.473 m)   Wt 160 lb 9.6 oz (72.8 kg)   LMP 01/05/2023 (Exact Date)   SpO2 (!) 70%   BMI 33.57 kg/m  Wt Readings from  Last 3 Encounters:  01/31/23 160 lb 9.6 oz (72.8 kg)  09/14/22 163 lb 3.2 oz (74 kg)  08/05/22 165 lb 3.2 oz (74.9 kg)       [name], NP

## 2023-02-13 ENCOUNTER — Encounter (INDEPENDENT_AMBULATORY_CARE_PROVIDER_SITE_OTHER): Payer: Self-pay

## 2023-02-15 ENCOUNTER — Encounter (INDEPENDENT_AMBULATORY_CARE_PROVIDER_SITE_OTHER): Payer: Self-pay

## 2023-02-15 ENCOUNTER — Telehealth (INDEPENDENT_AMBULATORY_CARE_PROVIDER_SITE_OTHER): Payer: Self-pay | Admitting: Family

## 2023-02-15 NOTE — Telephone Encounter (Signed)
Returned call to [facility], she is currently driving home.  She would like Spenser to call her back after 4:15.

## 2023-02-15 NOTE — Telephone Encounter (Signed)
Been on pump since Friday and has been having lows in the morning, She did talk with Spenser about those.  She is having a spike then right away dropping.  She is putting in the carbs she is eating, she puts in during or after she eats and drops.  For her shots it was a carb ratio of 1:5, carb ratio in her pump is 1:6.  She has the app open but unsure how to make the pump connect as it has been a while.  Her Dexcom is reading to the clinic. She has a break from class at 1:20 - 1:45 pm otherwise prefers a [employer]. Confirmed she will get G7 at local pharmacy and needs a few samples until [facility] can send sites.  She does not need any other supplies right now. Told her I will update Spenser and will get back with her either during her break or send a mychart message.

## 2023-02-15 NOTE — Telephone Encounter (Signed)
  Name of who is calling: Ernesto Dorgan; self   Best contact number: [phone]  Provider they see: Leafy Ro, np  Reason for call: Kalman Shan has called in wanting to speak with the provider or nurse regarding her blood sugar, she stated its been dropping and that she is back on the pump. She is requesting a call back.     PRESCRIPTION REFILL ONLY  Name of prescription:  Pharmacy:

## 2023-02-15 NOTE — Telephone Encounter (Signed)
Chelsey [name] has called back. Stating that she received the Mychart message, she has requested to speak with the provider for 2 to 3 mins.

## 2023-03-29 ENCOUNTER — Other Ambulatory Visit (INDEPENDENT_AMBULATORY_CARE_PROVIDER_SITE_OTHER): Payer: Self-pay | Admitting: Family

## 2023-03-29 DIAGNOSIS — E109 Type 1 diabetes mellitus without complications: Secondary | ICD-10-CM

## 2023-05-03 ENCOUNTER — Ambulatory Visit (INDEPENDENT_AMBULATORY_CARE_PROVIDER_SITE_OTHER): Payer: Managed Care, Other (non HMO) | Admitting: Family

## 2023-05-03 ENCOUNTER — Encounter (INDEPENDENT_AMBULATORY_CARE_PROVIDER_SITE_OTHER): Payer: Self-pay | Admitting: Family

## 2023-05-03 VITALS — BP 116/70 | HR 62 | Wt 168.8 lb

## 2023-05-03 DIAGNOSIS — E1065 Type 1 diabetes mellitus with hyperglycemia: Secondary | ICD-10-CM | POA: Diagnosis not present

## 2023-05-03 DIAGNOSIS — Z4681 Encounter for fitting and adjustment of insulin pump: Secondary | ICD-10-CM | POA: Diagnosis not present

## 2023-05-03 LAB — POCT GLYCOSYLATED HEMOGLOBIN (HGB A1C): Hemoglobin A1C: 6.5 % — AB (ref 4.0–5.6)

## 2023-05-03 LAB — POCT GLUCOSE (DEVICE FOR HOME USE): POC Glucose: 72 mg/dl (ref 70–99)

## 2023-05-03 NOTE — Progress Notes (Signed)
Pediatric Endocrinology Diabetes Consultation Follow-up Visit  Chelsey [name] 2001-02-25 [id number]  Chief Complaint: Follow-up type 1 diabetes   [name] Sellar, NP   HPI: Chelsey [name]  is a 22 y.o. female presenting for follow-up of type 1 diabetes. she is accompanied to this visit by her her mother, older brother and older sister. .  1. She presented to the ER on 10/26/2017 with 2 months of polyuria, polydipsia and weigh tloss. Her glucose was 547 on arrival to ER. She was admitted to PICU for IV insulin and fluids. Once stabilized she was transferred to the pediatric floor for diabetes education and was started on Basaglar and Novolog insulin.   2. Since her last visit to clinic on 01/2023 [name] has been generally healthy.  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   She is using Tandem Tslim insulin pump. She is wearing the Dexcom G7 but has not done the pump update so she is not using control iQ. She boluses while she is eating or right after. Estimates carb intake is 30-40 grams per meal. Hypoglycemia occurs occasionally but has not had many recently. She is able to feel symptoms when she is under 80.    Insulin regimen:  - 37 units of Semglee  - Novolog 120/30/6 plan   Basal Rates 12AM 1.60   6am 9am 1.55  1.65  11pm 1.60        38.50    Insulin to Carbohydrate Ratio 12AM 9  6am 6  11am 6  9pm 6       Insulin Sensitivity Factor 12AM 30   6am 30  11am 30   9pm  40       Target Blood Glucose 12AM 120                 Hypoglycemia: Able to feel low blood sugars.  No glucagon needed recently.  Insulin Pump and CGm download    Med-alert ID: Not currently wearing. Injection sites: arms, legs and abdomen. Annual labs due: 05/2023 Ophthalmology due: 10/2023 Discussed importance today.     3. ROS: Greater than 10 systems reviewed with pertinent positives listed in HPI, otherwise neg. Constitutional: Sleeping well. 7  lbs weight gain  Eyes: No changes in vision. No blurry vision.  Ears/Nose/Mouth/Throat: No difficulty swallowing. No neck pain  Cardiovascular: No palpitations or tachycardia.  Respiratory: No increased work of breathing. No SOB  Gastrointestinal: No constipation or diarrhea. No abdominal pain Genitourinary: No nocturia, no polyuria Musculoskeletal: No joint pain Neurologic: Normal sensation, no tremor Endocrine: No polydipsia.  No hyperpigmentation Psychiatric:. Denies depression and anxiety.   Past Medical History:   Past Medical History:  Diagnosis Date   Diabetes mellitus without complication (HCC)    Phreesia 08/07/2020   Liver laceration 01/16/14   sledding accident    Medications:  Outpatient Encounter Medications as of 05/03/2023  Medication Sig   Blood Glucose Monitoring Suppl (ACCU-CHEK GUIDE) w/Device KIT Use to check blood sugar 6 times per day   Continuous Blood Gluc Sensor (DEXCOM G7 SENSOR) MISC Change sensor every 10 days   Glucagon (BAQSIMI TWO PACK) 3 MG/DOSE POWD Place 3 mg into the nose as needed.   insulin glargine-yfgn (SEMGLEE, YFGN,) 100 UNIT/ML Pen INJECT UP TO 50 UNITS PER DAY IF PUMP FAILS   insulin lispro (HUMALOG KWIKPEN) 100 UNIT/ML KwikPen INJECT UP TO 50 UNITS SUBCUTANEOUSLY EVERY DAY if pump fails.   insulin lispro (HUMALOG) 100 UNIT/ML injection USE 300  UNITS IN INSULIN PUMP EVERY 48 HOURS   Insulin Syringe-Needle U-100 (BD INSULIN SYRINGE ULTRAFINE) 31G X 5/16" 0.5 ML MISC Use to inject insulin 5 times per day   famotidine (PEPCID) 20 MG tablet Take 1 tablet (20 mg total) by mouth daily. (Patient not taking: Reported on 01/31/2023)   No facility-administered encounter medications on file as of 05/03/2023.    Allergies: No Known Allergies  Surgical History: No past surgical history on file.  Family History:  Family History  Problem Relation Age of Onset   Learning disabilities Mother    Thyroid disease Maternal Grandmother    Diabetes Maternal  Grandfather    Hyperlipidemia Maternal Grandfather    Vision loss Maternal Grandfather    Learning disabilities Cousin        mat cousin has ADHD     Social History: Lives with: Mother, older brother and older sister.  [employer] at [employer]   Physical Exam:  Vitals:   05/03/23 0936  BP: 116/70  Pulse: 62  Weight: 168 lb 12.8 oz (76.6 kg)     BP 116/70   Pulse 62   Wt 168 lb 12.8 oz (76.6 kg)   BMI 35.28 kg/m  Body mass index: body mass index is 35.28 kg/m. Growth %ile SmartLinks can only be used for patients less than 59 years old.  Ht Readings from Last 3 Encounters:  01/31/23 4\' 10"  (1.473 m)  08/05/22 4\' 10"  (1.473 m)  01/28/22 4\' 10"  (1.473 m)   Wt Readings from Last 3 Encounters:  05/03/23 168 lb 12.8 oz (76.6 kg)  01/31/23 161 lb (73 kg)  01/31/23 160 lb 9.6 oz (72.8 kg)    Physical Exam  General: healthy appearing female in no acute distress.   Head: Normocephalic, atraumatic.   Eyes:  Pupils equal and round. EOMI.   Sclera white.  No eye drainage.   Ears/Nose/Mouth/Throat: Nares patent, no nasal drainage.  Normal dentition, mucous membranes moist.   Neck: supple, no cervical lymphadenopathy, no thyromegaly Cardiovascular: regular rate, normal S1/S2, no murmurs Respiratory: No increased work of breathing.  Lungs clear to auscultation bilaterally.  No wheezes. Abdomen: soft, nontender, nondistended. No appreciable masses  Extremities: warm, well perfused, cap refill < 2 sec.   Musculoskeletal: Normal muscle mass.  Normal strength Skin: warm, dry.  No rash or lesions. Neurologic: alert and oriented, normal speech, no tremor   Labs: Last hemoglobin A1c: 8% on 01/2023 Results for orders placed or performed in visit on 05/03/23  POCT glycosylated hemoglobin (Hb A1C)  Result Value Ref Range   Hemoglobin A1C 6.5 (A) 4.0 - 5.6 %   HbA1c POC (<> result, manual entry)     HbA1c, POC (prediabetic range)     HbA1c, POC (controlled diabetic range)    POCT Glucose  (Device for Home Use)  Result Value Ref Range   Glucose Fasting, POC     POC Glucose 72 70 - 99 mg/dl     Assessment/Plan: Tony is a 22 y.o. female with type 1 diabetes on tslim insulin pump but not currently using control iQ. She is having a pattern of hypoglycemia between 12am-6am. Hemoglobin A1c is 6.5% today which meets ADA goal of <7%. Her time in target range is 61%.    1-2. type 1 diabetes mellitus in pediatric patient (HCC)/Hyperglycemia/  - Reviewed insulin pump and CGM download. Discussed trends and patterns.  - Rotate pump sites to prevent scar tissue.  - bolus 15 minutes prior to eating to limit blood  sugar spikes.  - Reviewed carb counting and importance of accurate carb counting.  - Discussed signs and symptoms of hypoglycemia. Always have glucose available.  - POCT glucose and hemoglobin A1c  - Reviewed growth chart.  - Discussed tandem/Dexcom G7 update and gave instructions on how to update software. She will have better control once restarting Control IQ.   3. Insulin pump titration  Basal Rates 12AM 1.60 --> 1.50   6am 9am 1.55  1.65  11pm 1.60        38 units per day    Insulin to Carbohydrate Ratio 12AM 9  6am 6  11am 6  9pm 6       Insulin Sensitivity Factor 12AM 30 --> 40   6am 30  11am 30   9pm  40          Follow-up: 3 months   LOS: >40  spent today reviewing the medical chart, counseling the patient/family, and documenting today's visit.  When a patient is on insulin, intensive monitoring of blood glucose levels is necessary to avoid hyperglycemia and hypoglycemia. Severe hyperglycemia/hypoglycemia can lead to hospital admissions and be life threatening.    Gretchen Short,  FNP-C  Pediatric Specialist  [address]  [place], [address]  Tele: [phone]

## 2023-05-03 NOTE — Patient Instructions (Signed)
Basal Rates 12AM 1.60 --> 1.50   6am 9am 1.55  1.65  11pm 1.60        38 units per day    Insulin to Carbohydrate Ratio 12AM 9  6am 6  11am 6  9pm 6       Insulin Sensitivity Factor 12AM 30 --> 40   6am 30  11am 30   9pm  40        Hi!  The t:slim X2 with control IQ technology insulin pump is now compatible with the Dexcom G7 continuous glucose monitor (CGM). There are a few important key points to understand:  Software update (for existing t:slim users) -To use the t:slim with the Dexcom G7 this requires t:slim software 7.7 (not 7.6)  -To check what software your pump is currently using unlock your pump then press options then press Mypump then press Pump Info then scroll down to see what it states under t:slim software   -Refer to https://www.tandemdiabetes.com/support/software-updates/control-iq-technology or call [phone] for assistance with upgrade  Dexcom G7 -The t:slimX2 compatible Dexcom G7 CGM will have a white line under the serial number printed on the box (refer to image below)   -The pharmacy cannot control if they can order the Dexcom G7 with or without the line. They have the same national drug code [facility]) numbers, thus making it impossible to specifically request the Dexcom G7 with the line.  -If you received an incompatible, Dexcom G7 sensor to communicate with your t:slimX2 insulin pump with 7.8 software installed, please visit [email or web address] to request a compatible sensor or contact Dexcom customer support at [phone].  -Eventually, all Dexcom G7 with the line will be the ONLY Dexcom G7 product available once Dexcom has sold all Dexcom G7 without the line.    Thank you!

## 2023-05-21 ENCOUNTER — Encounter (INDEPENDENT_AMBULATORY_CARE_PROVIDER_SITE_OTHER): Payer: Self-pay

## 2023-06-03 ENCOUNTER — Encounter (INDEPENDENT_AMBULATORY_CARE_PROVIDER_SITE_OTHER): Payer: Self-pay

## 2023-06-16 ENCOUNTER — Encounter (INDEPENDENT_AMBULATORY_CARE_PROVIDER_SITE_OTHER): Payer: Self-pay

## 2023-07-14 ENCOUNTER — Encounter (INDEPENDENT_AMBULATORY_CARE_PROVIDER_SITE_OTHER): Payer: Self-pay

## 2023-07-30 ENCOUNTER — Encounter (INDEPENDENT_AMBULATORY_CARE_PROVIDER_SITE_OTHER): Payer: Self-pay

## 2023-08-01 ENCOUNTER — Telehealth: Payer: Self-pay

## 2023-08-01 MED ORDER — DEXCOM G7 SENSOR MISC: 5 refills | Status: DC

## 2023-08-03 ENCOUNTER — Encounter (INDEPENDENT_AMBULATORY_CARE_PROVIDER_SITE_OTHER): Payer: Self-pay | Admitting: Family

## 2023-08-03 ENCOUNTER — Ambulatory Visit (INDEPENDENT_AMBULATORY_CARE_PROVIDER_SITE_OTHER): Payer: Managed Care, Other (non HMO) | Admitting: Family

## 2023-08-03 VITALS — BP 112/76 | HR 80 | Wt 163.6 lb

## 2023-08-03 DIAGNOSIS — E1065 Type 1 diabetes mellitus with hyperglycemia: Secondary | ICD-10-CM

## 2023-08-03 DIAGNOSIS — Z9641 Presence of insulin pump (external) (internal): Secondary | ICD-10-CM | POA: Diagnosis not present

## 2023-08-03 LAB — POCT GLYCOSYLATED HEMOGLOBIN (HGB A1C): Hemoglobin A1C: 6.8 % — AB (ref 4.0–5.6)

## 2023-08-03 LAB — POCT GLUCOSE (DEVICE FOR HOME USE): POC Glucose: 130 mg/dL — AB (ref 70–99)

## 2023-08-03 NOTE — Progress Notes (Signed)
Pediatric Endocrinology Diabetes Consultation Follow-up Visit  Chelsey [name] 01/11/2001 [id number]  Chief Complaint: Follow-up type 1 diabetes   [name] Sellar, NP   HPI: Chelsey [name]  is a 22 y.o. female presenting for follow-up of type 1 diabetes. she is accompanied to this visit by her her mother, older brother and older sister. .  1. She presented to the ER on 10/26/2017 with 2 months of polyuria, polydipsia and weigh tloss. Her glucose was 547 on arrival to ER. She was admitted to PICU for IV insulin and fluids. Once stabilized she was transferred to the pediatric floor for diabetes education and was started on Basaglar and Novolog insulin.   2. Since her last visit to clinic on 04/2023 [name] has been generally healthy.  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   Reports that diabetes care has been going well since last visit. She using Dexcom G7 and Tandem Tslim insulin pump. She rarely has failed pump sites or CGM. She consistently boluses with carb intake, usually while she is eating. She accurately carb counts and estimates around 35 grams per meal. She rarely has low blood sugars, none severe or requiring glucagon.   Insulin regimen: Tandem Tslim pump and Dexcom G7  Basal Rates 12AM 1.50   6am 9am 1.55  1.65  11pm 1.60        38 units per day    Insulin to Carbohydrate Ratio 12AM 9  6am 6  11am 6  9pm 6       Insulin Sensitivity Factor 12AM 40   6am 30  11am 30   9pm  40         Target Blood Glucose 12AM 120                 Hypoglycemia: Able to feel low blood sugars.  No glucagon needed recently.  Insulin Pump and CGm download    Med-alert ID: Not currently wearing. Injection sites: arms, legs and abdomen. Annual labs due: Ordered  Ophthalmology due: 10/2023 Discussed importance today.     3. ROS: Greater than 10 systems reviewed with pertinent positives listed in HPI, otherwise  neg. Constitutional: Sleeping well. 5 lbs weight loss.  Eyes: No changes in vision. No blurry vision.  Ears/Nose/Mouth/Throat: No difficulty swallowing. No neck pain  Cardiovascular: No palpitations or tachycardia.  Respiratory: No increased work of breathing. No SOB  Gastrointestinal: No constipation or diarrhea. No abdominal pain Genitourinary: No nocturia, no polyuria Musculoskeletal: No joint pain Neurologic: Normal sensation, no tremor Endocrine: No polydipsia.  No hyperpigmentation Psychiatric:. Denies depression and anxiety.   Past Medical History:   Past Medical History:  Diagnosis Date   Diabetes mellitus without complication (HCC)    Phreesia 08/07/2020   Liver laceration 01/16/14   sledding accident    Medications:  Outpatient Encounter Medications as of 08/03/2023  Medication Sig   Blood Glucose Monitoring Suppl (ACCU-CHEK GUIDE) w/Device KIT Use to check blood sugar 6 times per day   Continuous Glucose Sensor (DEXCOM G7 SENSOR) MISC Change sensor every 10 days   insulin lispro (HUMALOG) 100 UNIT/ML injection USE 300 UNITS IN INSULIN PUMP EVERY 48 HOURS   famotidine (PEPCID) 20 MG tablet Take 1 tablet (20 mg total) by mouth daily. (Patient not taking: Reported on 01/31/2023)   Glucagon (BAQSIMI TWO PACK) 3 MG/DOSE POWD Place 3 mg into the nose as needed. (Patient not taking: Reported on 08/03/2023)   insulin glargine-yfgn (SEMGLEE, YFGN,) 100 UNIT/ML Pen  INJECT UP TO 50 UNITS PER DAY IF PUMP FAILS (Patient not taking: Reported on 08/03/2023)   insulin lispro (HUMALOG KWIKPEN) 100 UNIT/ML KwikPen INJECT UP TO 50 UNITS SUBCUTANEOUSLY EVERY DAY if pump fails. (Patient not taking: Reported on 08/03/2023)   Insulin Syringe-Needle U-100 (BD INSULIN SYRINGE ULTRAFINE) 31G X 5/16" 0.5 ML MISC Use to inject insulin 5 times per day (Patient not taking: Reported on 08/03/2023)   No facility-administered encounter medications on file as of 08/03/2023.    Allergies: No Known  Allergies  Surgical History: History reviewed. No pertinent surgical history.  Family History:  Family History  Problem Relation Age of Onset   Learning disabilities Mother    Thyroid disease Maternal Grandmother    Diabetes Maternal Grandfather    Hyperlipidemia Maternal Grandfather    Vision loss Maternal Grandfather    Learning disabilities Cousin        mat cousin has ADHD     Social History: Lives with: Mother, older brother and older sister.  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   Physical Exam:  Vitals:   08/03/23 0930  BP: 112/76  Pulse: 80  Weight: 163 lb 9.6 oz (74.2 kg)      BP 112/76   Pulse 80   Wt 163 lb 9.6 oz (74.2 kg)   LMP 07/29/2023   BMI 34.19 kg/m  Body mass index: body mass index is 34.19 kg/m. Growth %ile SmartLinks can only be used for patients less than 8 years old.  Ht Readings from Last 3 Encounters:  01/31/23 4\' 10"  (1.473 m)  08/05/22 4\' 10"  (1.473 m)  01/28/22 4\' 10"  (1.473 m)   Wt Readings from Last 3 Encounters:  08/03/23 163 lb 9.6 oz (74.2 kg)  05/03/23 168 lb 12.8 oz (76.6 kg)  01/31/23 161 lb (73 kg)    Physical Exam  General: Well developed, well nourished female in no acute distress.   Head: Normocephalic, atraumatic.   Eyes:  Pupils equal and round. EOMI.   Sclera white.  No eye drainage.   Ears/Nose/Mouth/Throat: Nares patent, no nasal drainage.  Normal dentition, mucous membranes moist.   Neck: supple, no cervical lymphadenopathy, no thyromegaly Cardiovascular: regular rate, normal S1/S2, no murmurs Respiratory: No increased work of breathing.  Lungs clear to auscultation bilaterally.  No wheezes. Abdomen: soft, nontender, nondistended. No appreciable masses  Extremities: warm, well perfused, cap refill < 2 sec.   Musculoskeletal: Normal muscle mass.  Normal strength Skin: warm, dry.  No rash or lesions. Neurologic: alert and oriented, normal speech, no tremor   Labs: Last hemoglobin A1c: 6.5% on  04/2023 Results for orders placed or performed in visit on 08/03/23  POCT Glucose (Device for Home Use)  Result Value Ref Range   Glucose Fasting, POC     POC Glucose 130 (A) 70 - 99 mg/dl  POCT glycosylated hemoglobin (Hb A1C)  Result Value Ref Range   Hemoglobin A1C 6.8 (A) 4.0 - 5.6 %   HbA1c POC (<> result, manual entry)     HbA1c, POC (prediabetic range)     HbA1c, POC (controlled diabetic range)       Assessment/Plan: Alissha is a 22 y.o. female with type 1 diabetes on tslim insulin pump and Dexcom G7. Hemoglobin A1 is 6.8% which meets ADA goal of <7%. Her time in target range is 74%, meeting goal of >70%.    1-2. type 1 diabetes mellitus in pediatric patient (HCC)/Hyperglycemia/  - Reviewed insulin pump and CGM download. Discussed trends  and patterns.  - Rotate pump sites to prevent scar tissue.  - bolus 15 minutes prior to eating to limit blood sugar spikes.  - Reviewed carb counting and importance of accurate carb counting.  - Discussed signs and symptoms of hypoglycemia. Always have glucose available.  - POCT glucose and hemoglobin A1c  - Reviewed growth chart.  - Discussed referral to adult endocrinology. Place to [facility] endocrinology  - Lab Orders         COMPLETE METABOLIC PANEL WITH GFR         Lipid panel         Microalbumin / creatinine urine ratio         T4, free         TSH         POCT Glucose (Device for Home Use)         POCT glycosylated hemoglobin (Hb A1C)     3. Insulin pump titration  Insulin pump in place.   - (back up plan)  37 units of Semglee  - Novolog 120/30/6 plan     LOS: >40  spent today reviewing the medical chart, counseling the patient/family, and documenting today's visit.   When a patient is on insulin, intensive monitoring of blood glucose levels is necessary to avoid hyperglycemia and hypoglycemia. Severe hyperglycemia/hypoglycemia can lead to hospital admissions and be life threatening.    Gretchen Short,  FNP-C   Pediatric Specialist  [address]  [place], [address]  Tele: [phone]

## 2023-08-03 NOTE — Patient Instructions (Signed)
It was a pleasure seeing you in clinic today. Please do not hesitate to contact me if you have questions or concerns.   Please sign up for MyChart. This is a communication tool that allows you to send an email directly to me. This can be used for questions, prescriptions and blood sugar reports. We will also release labs to you with instructions on MyChart. Please do not use MyChart if you need immediate or emergency assistance. Ask our wonderful front office staff if you need assistance.   - Referral to [facility]. It has been a pleasure getting to know you and help you with your diabetes care. Please reach out if you need anything.   - Hemoglobin A1c 6.8%.

## 2023-08-04 LAB — COMPLETE METABOLIC PANEL WITH GFR
AG Ratio: 1.5 (calc) (ref 1.0–2.5)
ALT: 13 U/L (ref 6–29)
AST: 17 U/L (ref 10–30)
Albumin: 4.2 g/dL (ref 3.6–5.1)
Alkaline phosphatase (APISO): 51 U/L (ref 31–125)
BUN: 9 mg/dL (ref 7–25)
CO2: 25 mmol/L (ref 20–32)
Calcium: 9.1 mg/dL (ref 8.6–10.2)
Chloride: 103 mmol/L (ref 98–110)
Creat: 0.66 mg/dL (ref 0.50–0.96)
Globulin: 2.8 g/dL (ref 1.9–3.7)
Glucose, Bld: 156 mg/dL — ABNORMAL HIGH (ref 65–139)
Potassium: 4.1 mmol/L (ref 3.5–5.3)
Sodium: 138 mmol/L (ref 135–146)
Total Bilirubin: 0.4 mg/dL (ref 0.2–1.2)
Total Protein: 7 g/dL (ref 6.1–8.1)
eGFR: 127 mL/min/{1.73_m2} (ref >=60)

## 2023-08-04 LAB — LIPID PANEL
Cholesterol: 174 mg/dL (ref <200)
HDL: 54 mg/dL (ref >=50)
LDL Cholesterol (Calc): 103 mg/dL — ABNORMAL HIGH
Non-HDL Cholesterol (Calc): 120 mg/dL (ref <130)
Total CHOL/HDL Ratio: 3.2 (calc) (ref <5.0)
Triglycerides: 79 mg/dL (ref <150)

## 2023-08-04 LAB — TSH: TSH: 1.74 m[IU]/L

## 2023-08-04 LAB — T4, FREE: Free T4: 1.1 ng/dL (ref 0.8–1.8)

## 2023-08-08 ENCOUNTER — Ambulatory Visit: Payer: Managed Care, Other (non HMO) | Admitting: Family

## 2023-08-09 ENCOUNTER — Encounter (INDEPENDENT_AMBULATORY_CARE_PROVIDER_SITE_OTHER): Payer: Self-pay

## 2023-08-15 ENCOUNTER — Encounter (INDEPENDENT_AMBULATORY_CARE_PROVIDER_SITE_OTHER): Payer: Self-pay

## 2023-09-12 ENCOUNTER — Ambulatory Visit (INDEPENDENT_AMBULATORY_CARE_PROVIDER_SITE_OTHER): Payer: Managed Care, Other (non HMO) | Admitting: Family

## 2023-09-12 ENCOUNTER — Encounter: Payer: Self-pay | Admitting: Family

## 2023-09-12 VITALS — Ht <= 58 in

## 2023-09-12 DIAGNOSIS — Z1159 Encounter for screening for other viral diseases: Secondary | ICD-10-CM | POA: Diagnosis not present

## 2023-09-12 DIAGNOSIS — Z0289 Encounter for other administrative examinations: Secondary | ICD-10-CM | POA: Diagnosis not present

## 2023-09-12 DIAGNOSIS — Z23 Encounter for immunization: Secondary | ICD-10-CM

## 2023-09-12 DIAGNOSIS — E109 Type 1 diabetes mellitus without complications: Secondary | ICD-10-CM | POA: Diagnosis not present

## 2023-09-12 NOTE — Progress Notes (Signed)
Patient ID: Chelsey [name], female    DOB: [birth year], 22 y.o.   MRN: [id number]  Chief Complaint  Patient presents with   Employment Physical   *Discussed the use of AI scribe software for clinical note transcription with the patient, who gave verbal consent to proceed.  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 She is in the process of completing the necessary health forms for employment, including a Tdap vaccine and TB blood test. She is unsure if the results can be faxed to the school or if she will need to pick them up.  The patient has a history of diabetes and is currently managing her condition with insulin. She is on Humalog (Lispro) and has Semglee as a backup in case her pump stops working. She also has a glucagon nasal spray in her car for emergencies.      Assessment & Plan:     Diabetes Mellitus  Patient on Humalog (Lispro) insulin via pump and has Semglee as backup. Also has glucagon nasal spray for emergencies. -Continue current insulin regimen. -Cleaned up medication list in chart.  Pre-employment physical - Patient requires TB testing and Tdap for school employment. -Administered Tdap vaccine today. -Order Quantiferon Gold test for TB. -Results expected by 09/15/2023. -Patient to pick up signed physical form from office at end of week or next week.      Need for hepatitis C screening test - Hepatitis C Antibody   Subjective:    Outpatient Medications Prior to Visit  Medication Sig Dispense Refill   Blood Glucose Monitoring Suppl (ACCU-CHEK GUIDE) w/Device KIT Use to check blood sugar 6 times per day 1 kit 1   Continuous Glucose Sensor (DEXCOM G7 SENSOR) MISC Change sensor every 10 days 3 each 5   Glucagon (BAQSIMI TWO PACK) 3 MG/DOSE POWD Place 3 mg into the nose as needed. 1 each 3   insulin glargine-yfgn (SEMGLEE,  YFGN,) 100 UNIT/ML Pen INJECT UP TO 50 UNITS PER DAY IF PUMP FAILS 15 mL 5   insulin lispro (HUMALOG) 100 UNIT/ML injection USE 300 UNITS IN INSULIN PUMP EVERY 48 HOURS 130 mL 1   Insulin Syringe-Needle U-100 (BD INSULIN SYRINGE ULTRAFINE) 31G X 5/16" 0.5 ML MISC Use to inject insulin 5 times per day 200 each 5   insulin lispro (HUMALOG KWIKPEN) 100 UNIT/ML KwikPen INJECT UP TO 50 UNITS SUBCUTANEOUSLY EVERY DAY if pump fails. 15 mL 2   famotidine (PEPCID) 20 MG tablet Take 1 tablet (20 mg total) by mouth daily. (Patient not taking: Reported on 09/12/2023) 30 tablet 5   No facility-administered medications prior to visit.   Past Medical History:  Diagnosis Date   Diabetes mellitus without complication (HCC)    Phreesia 08/07/2020   Liver laceration 01/16/14   sledding accident   No past surgical history on file. No Known Allergies    Objective:    Physical Exam Vitals and nursing note reviewed.  Constitutional:      Appearance: Normal appearance.  Cardiovascular:     Rate and Rhythm: Normal rate and regular rhythm.  Pulmonary:     Effort: Pulmonary effort is normal.     Breath sounds: Normal breath sounds.  Musculoskeletal:        General: Normal range of motion.  Skin:    General: Skin is warm and dry.  Neurological:     Mental Status:  She is alert.  Psychiatric:        Mood and Affect: Mood normal.        Behavior: Behavior normal.    Ht 4\' 10"  (1.473 m)   LMP 08/24/2023 (Approximate)   BMI 34.19 kg/m  Wt Readings from Last 3 Encounters:  08/03/23 163 lb 9.6 oz (74.2 kg)  05/03/23 168 lb 12.8 oz (76.6 kg)  01/31/23 161 lb (73 kg)       [name] Sellar, NP

## 2023-09-14 LAB — QUANTIFERON-TB GOLD PLUS
Mitogen-NIL: >10 [IU]/mL
NIL: 0.02 [IU]/mL
QuantiFERON-TB Gold Plus: NEGATIVE
TB1-NIL: 0 [IU]/mL
TB2-NIL: 0 [IU]/mL

## 2023-09-14 LAB — HEPATITIS C ANTIBODY: Hepatitis C Ab: NONREACTIVE

## 2023-10-06 ENCOUNTER — Encounter: Payer: Self-pay | Admitting: "Endocrinology

## 2023-10-06 ENCOUNTER — Ambulatory Visit (INDEPENDENT_AMBULATORY_CARE_PROVIDER_SITE_OTHER): Payer: Managed Care, Other (non HMO) | Admitting: "Endocrinology

## 2023-10-06 VITALS — BP 102/74 | HR 80 | Ht <= 58 in | Wt 171.4 lb

## 2023-10-06 DIAGNOSIS — Z9641 Presence of insulin pump (external) (internal): Secondary | ICD-10-CM | POA: Diagnosis not present

## 2023-10-06 DIAGNOSIS — E7800 Pure hypercholesterolemia, unspecified: Secondary | ICD-10-CM | POA: Diagnosis not present

## 2023-10-06 DIAGNOSIS — E109 Type 1 diabetes mellitus without complications: Secondary | ICD-10-CM | POA: Diagnosis not present

## 2023-10-06 MED ORDER — DEXCOM G7 SENSOR MISC: 5 refills | Status: DC

## 2023-10-06 NOTE — Progress Notes (Signed)
Outpatient Endocrinology Note Chelsey Warren, MD  10/06/23   Chelsey [name] 2001/11/11 [id number]  Referring Provider: Gretchen Short, NP Primary Care Provider: [name] Sellar, NP Reason for consultation: Subjective   Assessment & Plan  Diagnoses and all orders for this visit:  Controlled diabetes mellitus type 1 without complications (HCC)  Insulin pump in place  Pure hypercholesterolemia  Other orders -     Continuous Glucose Sensor (DEXCOM G7 SENSOR) MISC; Change sensor every 10 days    Diabetes Type I with no known complications, No results found for: "GFR" Hba1c goal less than 7, current Hba1c is  Lab Results  Component Value Date   HGBA1C 6.8 (A) 08/03/2023   Will recommend the following: T-slim pump, DexCom G7 with Humalog  Continue current settings Bolus 5 min before meals    No known contraindications/side effects to any of above medications Has unexpired Glucagon   -Last LD and Tg are as follows: Lab Results  Component Value Date   LDLCALC 103 (H) 08/03/2023    Lab Results  Component Value Date   TRIG 79 08/03/2023   -not on statin  -Follow low fat diet and exercise   -Blood pressure goal <140/90 - Microalbumin/creatinine goal is < 30 -Last MA/Cr is as follows: Lab Results  Component Value Date   MICROALBUR 0.4 06/15/2022   -not on ACE/ARB  -diet changes including salt restriction -limit eating outside -counseled BP targets per standards of diabetes care -uncontrolled blood pressure can lead to retinopathy, nephropathy and cardiovascular and atherosclerotic heart disease  Reviewed and counseled on: -A1C target -Blood sugar targets -Complications of uncontrolled diabetes  -Checking blood sugar before meals and bedtime and bring log next visit -All medications with mechanism of action and side effects -Hypoglycemia management: rule of 15's, Glucagon Emergency Kit and medical alert ID -low-carb low-fat plate-method diet -At  least 20 minutes of physical activity per day -Annual dilated retinal eye exam and foot exam -compliance and follow up needs -follow up as scheduled or earlier if problem gets worse  Call if blood sugar is less than 70 or consistently above 250    Take a 15 gm snack of carbohydrate at bedtime before you go to sleep if your blood sugar is less than 100.    If you are going to fast after midnight for a test or procedure, ask your physician for instructions on how to reduce/decrease your insulin dose.    Call if blood sugar is less than 70 or consistently above 250  -Treating a low sugar by rule of 15  (15 gms of sugar every 15 min until sugar is more than 70) If you feel your sugar is low, test your sugar to be sure If your sugar is low (less than 70), then take 15 grams of a fast acting Carbohydrate (3-4 glucose tablets or glucose gel or 4 ounces of juice or regular soda) Recheck your sugar 15 min after treating low to make sure it is more than 70 If sugar is still less than 70, treat again with 15 grams of carbohydrate          Don't drive the hour of hypoglycemia  If unconscious/unable to eat or drink by mouth, use glucagon injection or nasal spray baqsimi and call 911. Can repeat again in 15 min if still unconscious.  Return in about 3 months (around 01/06/2024).   I have reviewed current medications, nurse's notes, allergies, vital signs, past medical and surgical history, family medical history, and  social history for this encounter. Counseled patient on symptoms, examination findings, lab findings, imaging results, treatment decisions and monitoring and prognosis. The patient understood the recommendations and agrees with the treatment plan. All questions regarding treatment plan were fully answered.  Chelsey [place], MD  10/06/23    History of Present Illness Chelsey [name] is a 22 y.o. year old female who presents for evaluation of Type I diabetes mellitus.  Chelsey [name] was  first diagnosed at age 68.   Diabetes education +  Home diabetes regimen: T-slim pump, DexCom G7 with Humalog   COMPLICATIONS -  MI/Stroke -  retinopathy -  neuropathy -  nephropathy  SYMPTOMS REVIEWED - Polyuria - Weight loss - Blurred vision  BLOOD SUGAR DATA  CGM interpretation: At today's visit, we reviewed her CGM downloads. The full report is scanned in the media. Reviewing the CGM trends, BG are well controlled across the day.   Physical Exam  BP 102/74   Pulse 80   Ht 4\' 10"  (1.473 m)   Wt 171 lb 6.4 oz (77.7 kg)   LMP 08/24/2023 (Approximate)   SpO2 98%   BMI 35.82 kg/m    Constitutional: well developed, well nourished Head: normocephalic, atraumatic Eyes: sclera anicteric, no redness Neck: supple Lungs: normal respiratory effort Neurology: alert and oriented Skin: dry, no appreciable rashes Musculoskeletal: no appreciable defects Psychiatric: normal mood and affect Diabetic Foot Exam - Simple   No data filed      Current Medications Patient's Medications  New Prescriptions   No medications on file  Previous Medications   BLOOD GLUCOSE MONITORING SUPPL (ACCU-CHEK GUIDE) W/DEVICE KIT    Use to check blood sugar 6 times per day   GLUCAGON (BAQSIMI TWO PACK) 3 MG/DOSE POWD    Place 3 mg into the nose as needed.   INSULIN GLARGINE-YFGN (SEMGLEE, YFGN,) 100 UNIT/ML PEN    INJECT UP TO 50 UNITS PER DAY IF PUMP FAILS   INSULIN LISPRO (HUMALOG) 100 UNIT/ML INJECTION    USE 300 UNITS IN INSULIN PUMP EVERY 48 HOURS   INSULIN SYRINGE-NEEDLE U-100 (BD INSULIN SYRINGE ULTRAFINE) 31G X 5/16" 0.5 ML MISC    Use to inject insulin 5 times per day  Modified Medications   Modified Medication Previous Medication   CONTINUOUS GLUCOSE SENSOR (DEXCOM G7 SENSOR) MISC Continuous Glucose Sensor (DEXCOM G7 SENSOR) MISC      Change sensor every 10 days    Change sensor every 10 days  Discontinued Medications   No medications on file    Allergies No Known Allergies  Past  Medical History Past Medical History:  Diagnosis Date   Anxiety 11/08/2018   Diabetes mellitus without complication (HCC)    Phreesia 08/07/2020   Liver laceration 01/16/2014   sledding accident   Weight gain 11/08/2018    Past Surgical History History reviewed. No pertinent surgical history.  Family History family history includes Diabetes in her maternal grandfather; Hyperlipidemia in her maternal grandfather; Learning disabilities in her cousin and mother; Thyroid disease in her maternal grandmother; Vision loss in her maternal grandfather.  Social History Social History   Socioeconomic History   Marital status: Single    Spouse name: Not on file   Number of children: Not on file   Years of education: Not on file   Highest education level: Not on file  Occupational History   Not on file  Tobacco Use   Smoking status: Never   Smokeless tobacco: Never  Substance and Sexual Activity   Alcohol  use: Yes    Alcohol/week: 1.0 standard drink of alcohol    Types: 1 Shots of liquor per week   Drug use: No   Sexual activity: Never  Other Topics Concern   Not on file  Social History Narrative   Lives with Mother, step-Dad and 4 sibs.  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   Substitute teaching in [place]    Social Determinants of Health   Financial Resource Strain: Not on file  Food Insecurity: Not on file  Transportation Needs: Not on file  Physical Activity: Not on file  Stress: Not on file  Social Connections: Not on file  Intimate Partner Violence: Not on file    Lab Results  Component Value Date   HGBA1C 6.8 (A) 08/03/2023   HGBA1C 6.5 (A) 05/03/2023   HGBA1C 8.0 (A) 01/31/2023   Lab Results  Component Value Date   CHOL 174 08/03/2023   Lab Results  Component Value Date   HDL 54 08/03/2023   Lab Results  Component Value Date   LDLCALC 103 (H) 08/03/2023   Lab Results  Component Value Date   TRIG 79 08/03/2023   Lab Results   Component Value Date   CHOLHDL 3.2 08/03/2023   Lab Results  Component Value Date   CREATININE 0.66 08/03/2023   No results found for: "GFR" Lab Results  Component Value Date   MICROALBUR 0.4 06/15/2022      Component Value Date/Time   NA 138 08/03/2023 1009   K 4.1 08/03/2023 1009   CL 103 08/03/2023 1009   CO2 25 08/03/2023 1009   GLUCOSE 156 (H) 08/03/2023 1009   BUN 9 08/03/2023 1009   CREATININE 0.66 08/03/2023 1009   CALCIUM 9.1 08/03/2023 1009   PROT 7.0 08/03/2023 1009   ALBUMIN 4.3 10/26/2017 1236   AST 17 08/03/2023 1009   ALT 13 08/03/2023 1009   ALKPHOS 139 (H) 10/26/2017 1236   BILITOT 0.4 08/03/2023 1009   GFRNONAA NOT CALCULATED 10/31/2017 0553   GFRAA NOT CALCULATED 10/31/2017 0553      Latest Ref Rng & Units 08/03/2023   10:09 AM 11/06/2019   11:12 AM 10/31/2017    5:53 AM  BMP  Glucose 65 - 139 mg/dL 981  191  478   BUN 7 - 25 mg/dL 9  11  6    Creatinine 0.50 - 0.96 mg/dL 2.95  6.21  3.08   BUN/Creat Ratio 6 - 22 (calc) SEE NOTE:  NOT APPLICABLE    Sodium 135 - 146 mmol/L 138  136  137   Potassium 3.5 - 5.3 mmol/L 4.1  4.2  3.2   Chloride 98 - 110 mmol/L 103  103  107   CO2 20 - 32 mmol/L 25  25  25    Calcium 8.6 - 10.2 mg/dL 9.1  9.2  8.4        Component Value Date/Time   WBC 11.0 01/21/2014 0606   RBC 3.80 01/21/2014 0606   HGB 17.3 (H) 10/26/2017 1317   HCT 51.0 (H) 10/26/2017 1317   PLT 160 01/21/2014 0606   MCV 85.8 01/21/2014 0606   MCH 28.7 01/21/2014 0606   MCHC 33.4 01/21/2014 0606   RDW 12.6 01/21/2014 0606   LYMPHSABS 1.1 (L) 01/16/2014 1226   MONOABS 0.4 01/16/2014 1226   EOSABS 0.0 01/16/2014 1226   BASOSABS 0.0 01/16/2014 1226     Parts of this note may have been dictated using voice recognition software. There may  be variances in spelling and vocabulary which are unintentional. Not all errors are proofread. Please notify the There[facility] Parkin if any discrepancies are noted or if the meaning of any statement is not clear.

## 2023-10-10 ENCOUNTER — Telehealth: Payer: Self-pay | Admitting: Family

## 2023-10-10 DIAGNOSIS — Z0279 Encounter for issue of other medical certificate: Secondary | ICD-10-CM

## 2023-10-10 NOTE — Telephone Encounter (Signed)
Patient dropped off document  Physical form , to be filled out by provider. Patient requested to send it back via Call Patient to pick up within 5-days. Document is located in providers tray at front office.Please advise at Mobile [phone] (mobile)

## 2023-10-10 NOTE — Telephone Encounter (Signed)
RX sent

## 2023-10-13 ENCOUNTER — Ambulatory Visit: Payer: Managed Care, Other (non HMO) | Admitting: Family

## 2023-12-14 ENCOUNTER — Telehealth (INDEPENDENT_AMBULATORY_CARE_PROVIDER_SITE_OTHER): Payer: Self-pay | Admitting: Family

## 2023-12-14 DIAGNOSIS — E109 Type 1 diabetes mellitus without complications: Secondary | ICD-10-CM

## 2023-12-14 NOTE — Telephone Encounter (Signed)
  Name of who is calling: Chelsey [name]  Caller's Relationship to Patient: self  Best contact number: [phone]  Provider they see: Windell Hasty  Reason for call: Chelsey [name] called bc she has a question about what she needs to do about her prescriptions. Her insurance has changed and [facility] sent the script to [facility] but she has been referred to adult endo and not sure who is handling her medication going forward and wants to know if there is anything that she needs to do. Please contact her back in ref to this.   Call received 1/14 @ 2:01 pm   PRESCRIPTION REFILL ONLY  Name of prescription:  Pharmacy:

## 2023-12-14 NOTE — Telephone Encounter (Signed)
 Returned call to patient, left HIPAA approved VM for return phone call or to check mychart.

## 2023-12-15 MED ORDER — INSULIN LISPRO 100 UNIT/ML IJ SOLN: INTRAMUSCULAR | 1 refills | Status: AC

## 2023-12-15 MED ORDER — DEXCOM G7 SENSOR MISC: 1 refills | Status: AC

## 2024-01-11 ENCOUNTER — Ambulatory Visit: Payer: Managed Care, Other (non HMO) | Admitting: "Endocrinology

## 2024-01-30 ENCOUNTER — Telehealth: Payer: Self-pay

## 2024-01-30 NOTE — Telephone Encounter (Signed)
 Pt left voicemail for office to call her back, I attempted to reach pt with no luck. Left voice for pt to call office back.

## 2024-02-16 ENCOUNTER — Encounter: Payer: Self-pay | Admitting: "Endocrinology

## 2024-02-16 ENCOUNTER — Ambulatory Visit (INDEPENDENT_AMBULATORY_CARE_PROVIDER_SITE_OTHER): Admitting: "Endocrinology

## 2024-02-16 VITALS — BP 120/60 | HR 86 | Ht <= 58 in | Wt 171.0 lb

## 2024-02-16 DIAGNOSIS — E7800 Pure hypercholesterolemia, unspecified: Secondary | ICD-10-CM

## 2024-02-16 DIAGNOSIS — Z9641 Presence of insulin pump (external) (internal): Secondary | ICD-10-CM

## 2024-02-16 DIAGNOSIS — E1065 Type 1 diabetes mellitus with hyperglycemia: Secondary | ICD-10-CM | POA: Diagnosis not present

## 2024-02-16 LAB — POCT GLYCOSYLATED HEMOGLOBIN (HGB A1C): Hemoglobin A1C: 6.5 % — AB (ref 4.0–5.6)

## 2024-02-16 NOTE — Patient Instructions (Signed)

## 2024-02-16 NOTE — Progress Notes (Signed)
 Outpatient Endocrinology Note Chelsey Watson, MD  02/16/24   [name] 2000-12-25 [id number]  Referring Provider: [name] Sellar, NP Primary Care Provider: [name] Sellar, NP Reason for consultation: Subjective   Assessment & Plan  Diagnoses and all orders for this visit:  Type 1 diabetes mellitus with hyperglycemia (HCC) -     POCT glycosylated hemoglobin (Hb A1C)  Insulin pump in place  Pure hypercholesterolemia    Diabetes Type I with no known complications, No results found for: "GFR" Hba1c goal less than 7, current Hba1c is  Lab Results  Component Value Date   HGBA1C 6.5 (A) 02/16/2024   Will recommend the following: T-slim pump, DexCom G7 with Humalog  Changed settings as follows: Bolus 5 min before meals    No known contraindications/side effects to any of above medications Has unexpired Glucagon   -Last LD and Tg are as follows: Lab Results  Component Value Date   LDLCALC 103 (H) 08/03/2023    Lab Results  Component Value Date   TRIG 79 08/03/2023   -not on statin  -Follow low fat diet and exercise   -Blood pressure goal <140/90 - Microalbumin/creatinine goal is < 30 -Last MA/Cr is as follows: Lab Results  Component Value Date   MICROALBUR 0.4 06/15/2022   -not on ACE/ARB  -diet changes including salt restriction -limit eating outside -counseled BP targets per standards of diabetes care -uncontrolled blood pressure can lead to retinopathy, nephropathy and cardiovascular and atherosclerotic heart disease  Reviewed and counseled on: -A1C target -Blood sugar targets -Complications of uncontrolled diabetes  -Checking blood sugar before meals and bedtime and bring log next visit -All medications with mechanism of action and side effects -Hypoglycemia management: rule of 15's, Glucagon Emergency Kit and medical alert ID -low-carb low-fat plate-method diet -At least 20 minutes of physical activity per day -Annual dilated  retinal eye exam and foot exam -compliance and follow up needs -follow up as scheduled or earlier if problem gets worse  Call if blood sugar is less than 70 or consistently above 250    Take a 15 gm snack of carbohydrate at bedtime before you go to sleep if your blood sugar is less than 100.    If you are going to fast after midnight for a test or procedure, ask your physician for instructions on how to reduce/decrease your insulin dose.    Call if blood sugar is less than 70 or consistently above 250  -Treating a low sugar by rule of 15  (15 gms of sugar every 15 min until sugar is more than 70) If you feel your sugar is low, test your sugar to be sure If your sugar is low (less than 70), then take 15 grams of a fast acting Carbohydrate (3-4 glucose tablets or glucose gel or 4 ounces of juice or regular soda) Recheck your sugar 15 min after treating low to make sure it is more than 70 If sugar is still less than 70, treat again with 15 grams of carbohydrate          Don't drive the hour of hypoglycemia  If unconscious/unable to eat or drink by mouth, use glucagon injection or nasal spray baqsimi and call 911. Can repeat again in 15 min if still unconscious.  Return in about 4 weeks (around 03/15/2024).   I have reviewed current medications, nurse's notes, allergies, vital signs, past medical and surgical history, family medical history, and social history for this encounter. Counseled patient on symptoms, examination  findings, lab findings, imaging results, treatment decisions and monitoring and prognosis. The patient understood the recommendations and agrees with the treatment plan. All questions regarding treatment plan were fully answered.  Chelsey Mahomet, MD  02/16/24    History of Present Illness Chelsey [name] is a 23 y.o. year old female who presents for evaluation of Type I diabetes mellitus.  Chelsey [name] was first diagnosed at age 59.   Diabetes education +  Home diabetes  regimen: T-slim pump, DexCom G7 with Humalog   COMPLICATIONS -  MI/Stroke -  retinopathy -  neuropathy -  nephropathy  SYMPTOMS REVIEWED - Polyuria - Weight loss - Blurred vision  BLOOD SUGAR DATA  CGM interpretation: At today's visit, we reviewed her CGM downloads. The full report is scanned in the media. Reviewing the CGM trends, BG are low overnight and elevated in daytime.  Physical Exam  BP 120/60   Pulse 86   Ht 4\' 10"  (1.473 m)   Wt 171 lb (77.6 kg)   SpO2 97%   BMI 35.74 kg/m    Constitutional: well developed, well nourished Head: normocephalic, atraumatic Eyes: sclera anicteric, no redness Neck: supple Lungs: normal respiratory effort Neurology: alert and oriented Skin: dry, no appreciable rashes Musculoskeletal: no appreciable defects Psychiatric: normal mood and affect Diabetic Foot Exam - Simple   No data filed      Current Medications Patient's Medications  New Prescriptions   No medications on file  Previous Medications   BLOOD GLUCOSE MONITORING SUPPL (ACCU-CHEK GUIDE) W/DEVICE KIT    Use to check blood sugar 6 times per day   CONTINUOUS GLUCOSE SENSOR (DEXCOM G7 SENSOR) MISC    Change sensor every 10 days   GLUCAGON (BAQSIMI TWO PACK) 3 MG/DOSE POWD    Place 3 mg into the nose as needed.   INSULIN GLARGINE-YFGN (SEMGLEE, YFGN,) 100 UNIT/ML PEN    INJECT UP TO 50 UNITS PER DAY IF PUMP FAILS   INSULIN LISPRO (HUMALOG) 100 UNIT/ML INJECTION    USE 300 UNITS IN INSULIN PUMP EVERY 48 HOURS   INSULIN SYRINGE-NEEDLE U-100 (BD INSULIN SYRINGE ULTRAFINE) 31G X 5/16" 0.5 ML MISC    Use to inject insulin 5 times per day  Modified Medications   No medications on file  Discontinued Medications   No medications on file    Allergies No Known Allergies  Past Medical History Past Medical History:  Diagnosis Date   Anxiety 11/08/2018   Diabetes mellitus without complication (HCC)    Phreesia 08/07/2020   Liver laceration 01/16/2014   sledding accident    Weight gain 11/08/2018    Past Surgical History History reviewed. No pertinent surgical history.  Family History family history includes Diabetes in her maternal grandfather; Hyperlipidemia in her maternal grandfather; Learning disabilities in her cousin and mother; Thyroid disease in her maternal grandmother; Vision loss in her maternal grandfather.  Social History Social History   Socioeconomic History   Marital status: Single    Spouse name: Not on file   Number of children: Not on file   Years of education: Not on file   Highest education level: Bachelor's degree (e.g., BA, AB, BS)  Occupational History   Not on file  Tobacco Use   Smoking status: Never   Smokeless tobacco: Never  Substance and Sexual Activity   Alcohol use: Yes    Alcohol/week: 1.0 standard drink of alcohol    Types: 1 Shots of liquor per week   Drug use: No   Sexual activity: Never  Other Topics Concern   Not on file  Social History Narrative   Lives with Mother, step-Dad and 4 sibs.  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   Substitute teaching in [employer]    Social Drivers of Health   Financial Resource Strain: Low Risk  (10/11/2023)   Overall Financial Resource Strain (CARDIA)    Difficulty of Paying Living Expenses: Not hard at all  Food Insecurity: No Food Insecurity (10/11/2023)   Hunger Vital Sign    Worried About Running Out of Food in the Last Year: Never true    Ran Out of Food in the Last Year: Never true  Transportation Needs: No Transportation Needs (10/11/2023)   PRAPARE - Administrator, Civil Service (Medical): No    Lack of Transportation (Non-Medical): No  Physical Activity: Insufficiently Active (10/11/2023)   Exercise Vital Sign    Days of Exercise per Week: 3 days    Minutes of Exercise per Session: 20 min  Stress: No Stress Concern Present (10/11/2023)   [employer] of Occupational Health - Occupational Stress Questionnaire    Feeling  of Stress : Only a little  Social Connections: Moderately Integrated (10/11/2023)   Social Connection and Isolation Panel [NHANES]    Frequency of Communication with Friends and Family: Three times a week    Frequency of Social Gatherings with Friends and Family: More than three times a week    Attends Religious Services: 1 to 4 times per year    Active Member of Clubs or Organizations: Yes    Attends Banker Meetings: 1 to 4 times per year    Marital Status: Never married  Intimate Partner Violence: Not At Risk (11/06/2023)   Received from [facility]   HITS    Over the last 12 months how often did your partner physically hurt you?: Never    Over the last 12 months how often did your partner insult you or talk down to you?: Never    Over the last 12 months how often did your partner threaten you with physical harm?: Never    Over the last 12 months how often did your partner scream or curse at you?: Never    Lab Results  Component Value Date   HGBA1C 6.5 (A) 02/16/2024   HGBA1C 6.8 (A) 08/03/2023   HGBA1C 6.5 (A) 05/03/2023   Lab Results  Component Value Date   CHOL 174 08/03/2023   Lab Results  Component Value Date   HDL 54 08/03/2023   Lab Results  Component Value Date   LDLCALC 103 (H) 08/03/2023   Lab Results  Component Value Date   TRIG 79 08/03/2023   Lab Results  Component Value Date   CHOLHDL 3.2 08/03/2023   Lab Results  Component Value Date   CREATININE 0.66 08/03/2023   No results found for: "GFR" Lab Results  Component Value Date   MICROALBUR 0.4 06/15/2022      Component Value Date/Time   NA 138 08/03/2023 1009   K 4.1 08/03/2023 1009   CL 103 08/03/2023 1009   CO2 25 08/03/2023 1009   GLUCOSE 156 (H) 08/03/2023 1009   BUN 9 08/03/2023 1009   CREATININE 0.66 08/03/2023 1009   CALCIUM 9.1 08/03/2023 1009   PROT 7.0 08/03/2023 1009   ALBUMIN 4.3 10/26/2017 1236   AST 17 08/03/2023 1009   ALT 13 08/03/2023 1009   ALKPHOS 139  (H) 10/26/2017 1236   BILITOT 0.4 08/03/2023  1009   GFRNONAA NOT CALCULATED 10/31/2017 0553   GFRAA NOT CALCULATED 10/31/2017 0553      Latest Ref Rng & Units 08/03/2023   10:09 AM 11/06/2019   11:12 AM 10/31/2017    5:53 AM  BMP  Glucose 65 - 139 mg/dL 782  956  213   BUN 7 - 25 mg/dL 9  11  6    Creatinine 0.50 - 0.96 mg/dL 0.86  5.78  4.69   BUN/Creat Ratio 6 - 22 (calc) SEE NOTE:  NOT APPLICABLE    Sodium 135 - 146 mmol/L 138  136  137   Potassium 3.5 - 5.3 mmol/L 4.1  4.2  3.2   Chloride 98 - 110 mmol/L 103  103  107   CO2 20 - 32 mmol/L 25  25  25    Calcium 8.6 - 10.2 mg/dL 9.1  9.2  8.4        Component Value Date/Time   WBC 11.0 01/21/2014 0606   RBC 3.80 01/21/2014 0606   HGB 17.3 (H) 10/26/2017 1317   HCT 51.0 (H) 10/26/2017 1317   PLT 160 01/21/2014 0606   MCV 85.8 01/21/2014 0606   MCH 28.7 01/21/2014 0606   MCHC 33.4 01/21/2014 0606   RDW 12.6 01/21/2014 0606   LYMPHSABS 1.1 (L) 01/16/2014 1226   MONOABS 0.4 01/16/2014 1226   EOSABS 0.0 01/16/2014 1226   BASOSABS 0.0 01/16/2014 1226     Parts of this note may have been dictated using voice recognition software. There may be variances in spelling and vocabulary which are unintentional. Not all errors are proofread. Please notify the Thereasa Parkin if any discrepancies are noted or if the meaning of any statement is not clear.

## 2024-02-24 ENCOUNTER — Encounter: Payer: Self-pay | Admitting: "Endocrinology

## 2024-03-01 ENCOUNTER — Other Ambulatory Visit: Payer: Self-pay | Admitting: "Endocrinology

## 2024-03-01 ENCOUNTER — Telehealth: Payer: Self-pay

## 2024-03-01 NOTE — Telephone Encounter (Signed)
 Pt call lvm regarding insulin pump. Called pt back pt informed me that she's been having trouble with her insulin pump site  Pt has trying multiple sites with no success. Pt stated that her blood sugar got up to 300 before she realized that her pump wasn't working due to her site. Pt has been using her insulin pen in the meantime. Pt would like you know the current dose of the insulin pen she should be taking? Please advise.

## 2024-03-01 NOTE — Telephone Encounter (Signed)
 Spoke to pt regarding insulin instruction.

## 2024-03-04 ENCOUNTER — Encounter: Payer: Self-pay | Admitting: "Endocrinology

## 2024-03-04 DIAGNOSIS — E1065 Type 1 diabetes mellitus with hyperglycemia: Secondary | ICD-10-CM

## 2024-03-05 MED ORDER — BAQSIMI TWO PACK 3 MG/DOSE NA POWD: 3.0000 mg | NASAL | 3 refills | Status: AC | PRN

## 2024-03-06 ENCOUNTER — Encounter (INDEPENDENT_AMBULATORY_CARE_PROVIDER_SITE_OTHER): Payer: Self-pay

## 2024-03-12 ENCOUNTER — Ambulatory Visit: Payer: Managed Care, Other (non HMO) | Admitting: Family

## 2024-03-19 ENCOUNTER — Encounter (INDEPENDENT_AMBULATORY_CARE_PROVIDER_SITE_OTHER): Payer: Self-pay

## 2024-03-29 ENCOUNTER — Ambulatory Visit: Admitting: "Endocrinology

## 2024-04-13 ENCOUNTER — Encounter: Payer: Self-pay | Admitting: "Endocrinology

## 2024-04-18 ENCOUNTER — Telehealth: Payer: Self-pay | Admitting: Nutrition

## 2024-04-18 NOTE — Telephone Encounter (Signed)
 LVM to call me to discuss this sensor that she wants to order.

## 2024-04-19 ENCOUNTER — Other Ambulatory Visit: Payer: Self-pay | Admitting: "Endocrinology

## 2024-04-20 NOTE — Progress Notes (Signed)
 Lvm for pt to call back regarding CGM.

## 2024-05-02 ENCOUNTER — Ambulatory Visit: Admitting: "Endocrinology

## 2024-06-15 ENCOUNTER — Telehealth: Payer: Self-pay

## 2024-06-15 NOTE — Telephone Encounter (Signed)
 complete

## 2024-06-19 ENCOUNTER — Encounter: Payer: Self-pay | Admitting: "Endocrinology

## 2024-06-26 ENCOUNTER — Encounter: Payer: Self-pay | Admitting: "Endocrinology
